# Patient Record
Sex: Female | Born: 1935 | Race: Black or African American | Hispanic: No | Marital: Married | Smoking: Current every day smoker
Health system: Southern US, Community
[De-identification: ages and names within clinical notes are randomized; demographics above are authoritative.]

## PROBLEM LIST (undated history)

## (undated) DIAGNOSIS — J9811 Atelectasis: Secondary | ICD-10-CM

## (undated) DIAGNOSIS — C50919 Malignant neoplasm of unspecified site of unspecified female breast: Secondary | ICD-10-CM

## (undated) DIAGNOSIS — C50419 Malignant neoplasm of upper-outer quadrant of unspecified female breast: Secondary | ICD-10-CM

## (undated) DIAGNOSIS — E119 Type 2 diabetes mellitus without complications: Secondary | ICD-10-CM

## (undated) DIAGNOSIS — R531 Weakness: Secondary | ICD-10-CM

## (undated) DIAGNOSIS — I890 Lymphedema, not elsewhere classified: Secondary | ICD-10-CM

## (undated) DIAGNOSIS — R0902 Hypoxemia: Secondary | ICD-10-CM

## (undated) DIAGNOSIS — J90 Pleural effusion, not elsewhere classified: Secondary | ICD-10-CM

## (undated) DIAGNOSIS — I10 Essential (primary) hypertension: Secondary | ICD-10-CM

## (undated) HISTORY — DX: Hypoxemia: R09.02

## (undated) HISTORY — PX: BREAST SURGERY: SHX581

## (undated) HISTORY — DX: Weakness: R53.1

## (undated) HISTORY — DX: Atelectasis: J98.11

## (undated) HISTORY — DX: Malignant neoplasm of unspecified site of unspecified female breast: C50.919

## (undated) HISTORY — DX: Pleural effusion, not elsewhere classified: J90

## (undated) HISTORY — DX: Malignant neoplasm of upper-outer quadrant of unspecified female breast: C50.419

## (undated) HISTORY — DX: Lymphedema, not elsewhere classified: I89.0

---

## 1999-12-24 ENCOUNTER — Encounter: Admission: RE | Admit: 1999-12-24 | Discharge: 2000-03-23 | Payer: Self-pay | Admitting: *Deleted

## 2005-09-01 ENCOUNTER — Inpatient Hospital Stay (HOSPITAL_COMMUNITY): Admission: EM | Admit: 2005-09-01 | Discharge: 2005-09-08 | Payer: Self-pay | Admitting: Emergency Medicine

## 2005-09-02 ENCOUNTER — Ambulatory Visit: Payer: Self-pay | Admitting: Emergency Medicine

## 2005-09-03 ENCOUNTER — Ambulatory Visit: Payer: Self-pay | Admitting: Cardiology

## 2005-09-03 ENCOUNTER — Encounter: Payer: Self-pay | Admitting: Cardiology

## 2008-02-15 ENCOUNTER — Encounter: Admission: RE | Admit: 2008-02-15 | Discharge: 2008-02-15 | Payer: Self-pay | Admitting: Internal Medicine

## 2008-09-04 ENCOUNTER — Ambulatory Visit (HOSPITAL_COMMUNITY): Admission: RE | Admit: 2008-09-04 | Discharge: 2008-09-04 | Payer: Self-pay | Admitting: General Surgery

## 2008-09-06 ENCOUNTER — Ambulatory Visit: Payer: Self-pay | Admitting: Oncology

## 2008-09-07 ENCOUNTER — Ambulatory Visit (HOSPITAL_COMMUNITY): Admission: RE | Admit: 2008-09-07 | Discharge: 2008-09-07 | Payer: Self-pay | Admitting: General Surgery

## 2008-09-13 LAB — CBC WITH DIFFERENTIAL/PLATELET
Basophils Absolute: 0 10*3/uL (ref 0.0–0.1)
Eosinophils Absolute: 0.4 10*3/uL (ref 0.0–0.5)
MCH: 29.5 pg (ref 26.0–34.0)
MCHC: 33 g/dL (ref 32.0–36.0)
MONO%: 8.6 % (ref 0.0–13.0)
NEUT%: 62.5 % (ref 39.6–76.8)
Platelets: 277 10*3/uL (ref 145–400)
RBC: 4.72 10*6/uL (ref 3.70–5.32)
RDW: 15.4 % — ABNORMAL HIGH (ref 11.3–14.5)
WBC: 7.1 10*3/uL (ref 3.9–10.0)
lymph#: 1.7 10*3/uL (ref 0.9–3.3)

## 2008-09-14 LAB — COMPREHENSIVE METABOLIC PANEL
Albumin: 4.5 g/dL (ref 3.5–5.2)
BUN: 36 mg/dL — ABNORMAL HIGH (ref 6–23)
Calcium: 9.7 mg/dL (ref 8.4–10.5)
Chloride: 102 mEq/L (ref 96–112)
Glucose, Bld: 125 mg/dL — ABNORMAL HIGH (ref 70–99)
Potassium: 4 mEq/L (ref 3.5–5.3)

## 2008-09-14 LAB — CANCER ANTIGEN 27.29: CA 27.29: 72 U/mL — ABNORMAL HIGH (ref 0–39)

## 2008-10-27 ENCOUNTER — Ambulatory Visit: Payer: Self-pay | Admitting: Oncology

## 2008-10-31 LAB — COMPREHENSIVE METABOLIC PANEL
ALT: 14 U/L (ref 0–35)
Albumin: 4.1 g/dL (ref 3.5–5.2)
CO2: 21 mEq/L (ref 19–32)
Calcium: 9.4 mg/dL (ref 8.4–10.5)
Chloride: 100 mEq/L (ref 96–112)
Creatinine, Ser: 1.57 mg/dL — ABNORMAL HIGH (ref 0.40–1.20)

## 2008-10-31 LAB — CBC WITH DIFFERENTIAL/PLATELET
BASO%: 0.6 % (ref 0.0–2.0)
Basophils Absolute: 0 10*3/uL (ref 0.0–0.1)
HCT: 41.3 % (ref 34.8–46.6)
HGB: 13.6 g/dL (ref 11.6–15.9)
MCHC: 32.9 g/dL (ref 32.0–36.0)
MONO#: 0.4 10*3/uL (ref 0.1–0.9)
NEUT%: 56.2 % (ref 39.6–76.8)
WBC: 5 10*3/uL (ref 3.9–10.0)
lymph#: 1.6 10*3/uL (ref 0.9–3.3)

## 2008-10-31 LAB — CANCER ANTIGEN 27.29: CA 27.29: 69 U/mL — ABNORMAL HIGH (ref 0–39)

## 2009-01-01 ENCOUNTER — Ambulatory Visit: Payer: Self-pay | Admitting: Oncology

## 2009-01-01 LAB — CBC WITH DIFFERENTIAL/PLATELET
Eosinophils Absolute: 0.2 10*3/uL (ref 0.0–0.5)
HCT: 42 % (ref 34.8–46.6)
LYMPH%: 23.9 % (ref 14.0–49.7)
MONO#: 0.4 10*3/uL (ref 0.1–0.9)
NEUT#: 3.9 10*3/uL (ref 1.5–6.5)
NEUT%: 65.4 % (ref 38.4–76.8)
Platelets: 281 10*3/uL (ref 145–400)
WBC: 6 10*3/uL (ref 3.9–10.3)

## 2009-01-01 LAB — COMPREHENSIVE METABOLIC PANEL
BUN: 28 mg/dL — ABNORMAL HIGH (ref 6–23)
CO2: 21 mEq/L (ref 19–32)
Creatinine, Ser: 1.6 mg/dL — ABNORMAL HIGH (ref 0.40–1.20)
Glucose, Bld: 107 mg/dL — ABNORMAL HIGH (ref 70–99)
Total Bilirubin: 0.3 mg/dL (ref 0.3–1.2)

## 2009-01-01 LAB — CANCER ANTIGEN 27.29: CA 27.29: 109 U/mL — ABNORMAL HIGH (ref 0–39)

## 2009-02-14 ENCOUNTER — Encounter: Admission: RE | Admit: 2009-02-14 | Discharge: 2009-02-14 | Payer: Self-pay | Admitting: Oncology

## 2009-03-19 ENCOUNTER — Ambulatory Visit: Payer: Self-pay | Admitting: Oncology

## 2009-03-21 LAB — CBC WITH DIFFERENTIAL/PLATELET
BASO%: 0.4 % (ref 0.0–2.0)
EOS%: 4.6 % (ref 0.0–7.0)
HCT: 39.4 % (ref 34.8–46.6)
MCH: 29 pg (ref 25.1–34.0)
MCHC: 33.8 g/dL (ref 31.5–36.0)
NEUT%: 57.8 % (ref 38.4–76.8)
RBC: 4.58 10*6/uL (ref 3.70–5.45)
RDW: 15.2 % — ABNORMAL HIGH (ref 11.2–14.5)
lymph#: 1.6 10*3/uL (ref 0.9–3.3)
nRBC: 0 % (ref 0–0)

## 2009-03-21 LAB — COMPREHENSIVE METABOLIC PANEL
ALT: 15 U/L (ref 0–35)
AST: 16 U/L (ref 0–37)
Albumin: 4.2 g/dL (ref 3.5–5.2)
Alkaline Phosphatase: 89 U/L (ref 39–117)
BUN: 23 mg/dL (ref 6–23)
Creatinine, Ser: 1.34 mg/dL — ABNORMAL HIGH (ref 0.40–1.20)
Potassium: 4.4 mEq/L (ref 3.5–5.3)

## 2009-03-23 ENCOUNTER — Ambulatory Visit (HOSPITAL_COMMUNITY): Admission: RE | Admit: 2009-03-23 | Discharge: 2009-03-23 | Payer: Self-pay | Admitting: Oncology

## 2009-05-03 ENCOUNTER — Ambulatory Visit (HOSPITAL_BASED_OUTPATIENT_CLINIC_OR_DEPARTMENT_OTHER): Admission: RE | Admit: 2009-05-03 | Discharge: 2009-05-04 | Payer: Self-pay | Admitting: General Surgery

## 2009-05-03 ENCOUNTER — Encounter: Admission: RE | Admit: 2009-05-03 | Discharge: 2009-05-03 | Payer: Self-pay | Admitting: General Surgery

## 2009-05-03 ENCOUNTER — Encounter (INDEPENDENT_AMBULATORY_CARE_PROVIDER_SITE_OTHER): Payer: Self-pay | Admitting: General Surgery

## 2009-06-04 ENCOUNTER — Observation Stay (HOSPITAL_COMMUNITY): Admission: RE | Admit: 2009-06-04 | Discharge: 2009-06-05 | Payer: Self-pay | Admitting: General Surgery

## 2009-06-04 ENCOUNTER — Encounter (INDEPENDENT_AMBULATORY_CARE_PROVIDER_SITE_OTHER): Payer: Self-pay | Admitting: General Surgery

## 2009-06-27 ENCOUNTER — Ambulatory Visit: Admission: RE | Admit: 2009-06-27 | Discharge: 2009-09-22 | Payer: Self-pay | Admitting: Radiation Oncology

## 2009-10-11 ENCOUNTER — Ambulatory Visit: Payer: Self-pay | Admitting: Oncology

## 2009-10-15 LAB — CBC WITH DIFFERENTIAL/PLATELET
Basophils Absolute: 0 10*3/uL (ref 0.0–0.1)
Eosinophils Absolute: 0.1 10*3/uL (ref 0.0–0.5)
HCT: 38.7 % (ref 34.8–46.6)
HGB: 12.6 g/dL (ref 11.6–15.9)
NEUT#: 3.3 10*3/uL (ref 1.5–6.5)
NEUT%: 74.2 % (ref 38.4–76.8)
RDW: 15.3 % — ABNORMAL HIGH (ref 11.2–14.5)
lymph#: 0.6 10*3/uL — ABNORMAL LOW (ref 0.9–3.3)

## 2009-10-16 LAB — COMPREHENSIVE METABOLIC PANEL
Albumin: 4.2 g/dL (ref 3.5–5.2)
BUN: 26 mg/dL — ABNORMAL HIGH (ref 6–23)
CO2: 17 mEq/L — ABNORMAL LOW (ref 19–32)
Calcium: 9.3 mg/dL (ref 8.4–10.5)
Chloride: 102 mEq/L (ref 96–112)
Creatinine, Ser: 1.75 mg/dL — ABNORMAL HIGH (ref 0.40–1.20)
Glucose, Bld: 137 mg/dL — ABNORMAL HIGH (ref 70–99)
Potassium: 4.5 mEq/L (ref 3.5–5.3)

## 2009-10-16 LAB — LACTATE DEHYDROGENASE: LDH: 175 U/L (ref 94–250)

## 2009-10-16 LAB — CANCER ANTIGEN 27.29: CA 27.29: 27 U/mL (ref 0–39)

## 2009-10-16 LAB — VITAMIN D 25 HYDROXY (VIT D DEFICIENCY, FRACTURES): Vit D, 25-Hydroxy: 7 ng/mL — ABNORMAL LOW (ref 30–89)

## 2009-11-20 ENCOUNTER — Inpatient Hospital Stay (HOSPITAL_COMMUNITY): Admission: EM | Admit: 2009-11-20 | Discharge: 2009-11-22 | Payer: Self-pay | Admitting: Emergency Medicine

## 2010-01-08 ENCOUNTER — Encounter: Admission: RE | Admit: 2010-01-08 | Discharge: 2010-01-08 | Payer: Self-pay | Admitting: Oncology

## 2010-01-21 ENCOUNTER — Ambulatory Visit: Payer: Self-pay | Admitting: Oncology

## 2010-04-19 ENCOUNTER — Ambulatory Visit: Payer: Self-pay | Admitting: Oncology

## 2010-07-30 ENCOUNTER — Ambulatory Visit: Payer: Self-pay | Admitting: Oncology

## 2010-09-26 ENCOUNTER — Ambulatory Visit: Payer: Self-pay | Admitting: Oncology

## 2011-01-26 LAB — URINALYSIS, ROUTINE W REFLEX MICROSCOPIC
Bilirubin Urine: NEGATIVE
Glucose, UA: NEGATIVE mg/dL
Hgb urine dipstick: NEGATIVE
Ketones, ur: NEGATIVE mg/dL
Nitrite: NEGATIVE
Protein, ur: NEGATIVE mg/dL
Specific Gravity, Urine: 1.009 (ref 1.005–1.030)
Urobilinogen, UA: 0.2 mg/dL (ref 0.0–1.0)
pH: 6.5 (ref 5.0–8.0)

## 2011-01-26 LAB — POCT I-STAT, CHEM 8
BUN: 20 mg/dL (ref 6–23)
Calcium, Ion: 1.08 mmol/L — ABNORMAL LOW (ref 1.12–1.32)
Chloride: 105 mEq/L (ref 96–112)
Creatinine, Ser: 1.7 mg/dL — ABNORMAL HIGH (ref 0.4–1.2)
Glucose, Bld: 131 mg/dL — ABNORMAL HIGH (ref 70–99)
HCT: 40 % (ref 36.0–46.0)
Hemoglobin: 13.6 g/dL (ref 12.0–15.0)
Potassium: 4.3 mEq/L (ref 3.5–5.1)
Sodium: 135 mEq/L (ref 135–145)
TCO2: 23 mmol/L (ref 0–100)

## 2011-01-26 LAB — BASIC METABOLIC PANEL
BUN: 16 mg/dL (ref 6–23)
CO2: 20 mEq/L (ref 19–32)
CO2: 22 mEq/L (ref 19–32)
Calcium: 8.8 mg/dL (ref 8.4–10.5)
Calcium: 8.9 mg/dL (ref 8.4–10.5)
Chloride: 105 mEq/L (ref 96–112)
Creatinine, Ser: 1.58 mg/dL — ABNORMAL HIGH (ref 0.4–1.2)
Creatinine, Ser: 1.73 mg/dL — ABNORMAL HIGH (ref 0.4–1.2)
GFR calc Af Amer: 35 mL/min — ABNORMAL LOW (ref >60)
GFR calc non Af Amer: 32 mL/min — ABNORMAL LOW (ref >60)
Glucose, Bld: 234 mg/dL — ABNORMAL HIGH (ref 70–99)
Sodium: 136 mEq/L (ref 135–145)

## 2011-01-26 LAB — DIFFERENTIAL
Basophils Absolute: 0 10*3/uL (ref 0.0–0.1)
Basophils Relative: 0 % (ref 0–1)
Eosinophils Absolute: 0 10*3/uL (ref 0.0–0.7)
Eosinophils Relative: 0 % (ref 0–5)
Lymphocytes Relative: 4 % — ABNORMAL LOW (ref 12–46)
Lymphs Abs: 0.3 10*3/uL — ABNORMAL LOW (ref 0.7–4.0)
Monocytes Absolute: 0.7 10*3/uL (ref 0.1–1.0)
Monocytes Relative: 9 % (ref 3–12)
Neutro Abs: 6.2 10*3/uL (ref 1.7–7.7)
Neutrophils Relative %: 87 % — ABNORMAL HIGH (ref 43–77)

## 2011-01-26 LAB — GLUCOSE, CAPILLARY
Glucose-Capillary: 186 mg/dL — ABNORMAL HIGH (ref 70–99)
Glucose-Capillary: 186 mg/dL — ABNORMAL HIGH (ref 70–99)
Glucose-Capillary: 45 mg/dL — ABNORMAL LOW (ref 70–99)
Glucose-Capillary: 50 mg/dL — ABNORMAL LOW (ref 70–99)
Glucose-Capillary: 56 mg/dL — ABNORMAL LOW (ref 70–99)
Glucose-Capillary: 65 mg/dL — ABNORMAL LOW (ref 70–99)
Glucose-Capillary: 66 mg/dL — ABNORMAL LOW (ref 70–99)

## 2011-01-26 LAB — CBC
HCT: 37.7 % (ref 36.0–46.0)
Hemoglobin: 11.7 g/dL — ABNORMAL LOW (ref 12.0–15.0)
Hemoglobin: 12 g/dL (ref 12.0–15.0)
Hemoglobin: 12.2 g/dL (ref 12.0–15.0)
MCHC: 32.4 g/dL (ref 30.0–36.0)
MCHC: 32.4 g/dL (ref 30.0–36.0)
MCHC: 32.5 g/dL (ref 30.0–36.0)
MCV: 91.3 fL (ref 78.0–100.0)
MCV: 91.5 fL (ref 78.0–100.0)
Platelets: 204 10*3/uL (ref 150–400)
Platelets: 214 10*3/uL (ref 150–400)
RBC: 4.05 MIL/uL (ref 3.87–5.11)
RBC: 4.12 MIL/uL (ref 3.87–5.11)
RDW: 15.3 % (ref 11.5–15.5)
RDW: 15.5 % (ref 11.5–15.5)
WBC: 7 10*3/uL (ref 4.0–10.5)
WBC: 7.1 10*3/uL (ref 4.0–10.5)

## 2011-01-26 LAB — CULTURE, RESPIRATORY W GRAM STAIN

## 2011-01-26 LAB — SODIUM, URINE, RANDOM: Sodium, Ur: 102 mEq/L

## 2011-01-26 LAB — LIPID PANEL
Triglycerides: 96 mg/dL (ref <150)
VLDL: 19 mg/dL (ref 0–40)

## 2011-01-26 LAB — CREATININE, URINE, RANDOM: Creatinine, Urine: 46.9 mg/dL

## 2011-01-26 LAB — HEMOGLOBIN A1C
Hgb A1c MFr Bld: 6.1 % (ref 4.6–6.1)
Mean Plasma Glucose: 128 mg/dL

## 2011-02-16 LAB — COMPREHENSIVE METABOLIC PANEL
ALT: 37 U/L — ABNORMAL HIGH (ref 0–35)
BUN: 21 mg/dL (ref 6–23)
CO2: 24 mEq/L (ref 19–32)
Calcium: 9.7 mg/dL (ref 8.4–10.5)
GFR calc non Af Amer: 32 mL/min — ABNORMAL LOW (ref >60)
Glucose, Bld: 123 mg/dL — ABNORMAL HIGH (ref 70–99)
Sodium: 140 mEq/L (ref 135–145)

## 2011-02-16 LAB — HEMOGLOBIN AND HEMATOCRIT, BLOOD
HCT: 39.8 % (ref 36.0–46.0)
Hemoglobin: 12.9 g/dL (ref 12.0–15.0)

## 2011-02-16 LAB — GLUCOSE, CAPILLARY

## 2011-02-17 LAB — BASIC METABOLIC PANEL
CO2: 23 mEq/L (ref 19–32)
Chloride: 105 mEq/L (ref 96–112)
Creatinine, Ser: 1.73 mg/dL — ABNORMAL HIGH (ref 0.4–1.2)
GFR calc Af Amer: 35 mL/min — ABNORMAL LOW (ref >60)
Glucose, Bld: 93 mg/dL (ref 70–99)
Sodium: 136 mEq/L (ref 135–145)

## 2011-02-17 LAB — GLUCOSE, CAPILLARY: Glucose-Capillary: 129 mg/dL — ABNORMAL HIGH (ref 70–99)

## 2011-02-17 LAB — DIFFERENTIAL
Basophils Absolute: 0 10*3/uL (ref 0.0–0.1)
Eosinophils Absolute: 0.2 10*3/uL (ref 0.0–0.7)
Eosinophils Relative: 4 % (ref 0–5)
Lymphocytes Relative: 25 % (ref 12–46)
Lymphs Abs: 1.6 10*3/uL (ref 0.7–4.0)
Monocytes Absolute: 0.6 10*3/uL (ref 0.1–1.0)

## 2011-02-17 LAB — APTT: aPTT: 36 seconds (ref 24–37)

## 2011-02-17 LAB — CBC
Hemoglobin: 13.2 g/dL (ref 12.0–15.0)
MCHC: 33.3 g/dL (ref 30.0–36.0)
MCV: 90.1 fL (ref 78.0–100.0)
RBC: 4.39 MIL/uL (ref 3.87–5.11)

## 2011-02-18 LAB — GLUCOSE, CAPILLARY: Glucose-Capillary: 97 mg/dL (ref 70–99)

## 2011-03-20 ENCOUNTER — Other Ambulatory Visit: Payer: Self-pay | Admitting: Internal Medicine

## 2011-03-20 DIAGNOSIS — R05 Cough: Secondary | ICD-10-CM

## 2011-03-20 DIAGNOSIS — R0602 Shortness of breath: Secondary | ICD-10-CM

## 2011-03-25 NOTE — Op Note (Signed)
[name], [name]             ACCOUNT NO.:  [id number]   MEDICAL RECORD NO.:  [id number]          PATIENT TYPE:  INP   LOCATION:  0002                         FACILITY:  [facility]   PHYSICIAN:  Almond Lint, MD       DATE OF BIRTH:  04/09/1931   DATE OF PROCEDURE:  06/04/2009  DATE OF DISCHARGE:                               OPERATIVE REPORT   PREOPERATIVE DIAGNOSIS:  Left T2, N2, MX breast cancer.   POSTOPERATIVE DIAGNOSIS:  Left T2, N2, MX breast cancer.   PROCEDURE:  Left mastectomy.   INDICATIONS:  [name] is a 77 year old female who had multifocal  breast cancer with 3 separate lesions very distant from one another in  the same quadrant.  We attempted a quadrantectomy, knowing there would  be a poor cosmetic outcome.  This was the patient's wishes.  We had  multiple positive margins of that time and therefore she will undergo a  left mastectomy.  She had previously had an axillary lymph node  dissection with 7/16 positive nodes.   SURGEON:  Almond Lint, MD.   ANESTHESIA:  General and local.   FINDINGS:  Seroma at the site of the prior partial mastectomy and  another seroma at the axillary lymph node dissection.   SPECIMEN:  Left breast to pathology.   ESTIMATED BLOOD LOSS:  50 mL.   COMPLICATIONS:  None known.   PROCEDURE:  [name] was identified in the holding area and taken to  the operating room where she was placed supine on the operating room  table.  General anesthesia was induced.  The left breast was prepped and  draped in a sterile fashion.  Time-out was performed according to the  surgical safety check list.  When all was correct, we continued.  The  incision was marked from a point just above the inferior inframammary  fold to the left of the sternum.  The other corner of the incision was  the axilla.  The skin lines were marked making sure they were  approximately the same length.  The top flap was approached first.  A  mixture of 0.25%  Marcaine with epinephrine, 1% lidocaine and saline was  used to anesthetize the skin flaps.  This was done on both incisions  with the spinal needle.  The superior incision was made first.  Penetrating towel clips were used to raise the skin and the Bovie was  used to take the skin flap superiorly down to the intercostal region  just to the left of the sternum and superiorly to the lower port of the  clavicle.  Along laterally, the tail of the breast was taken down on the  tail of the pectoralis major.  Inferiorly, and the skin flaps were  created down to the serratus anterior.  Superiorly, the pectoralis  fascia was elevated with hemostats and the breast tissue was taken with  pectoralis fascia with the Bovie.  The perforating vessels below the  pectoralis major were coagulated.  There were 2 that required suture  ligation.  The sternal cavity was entered from the prior mastectomy and  this was aspirated.  Additionally, the cerumen axilla was aspirated.  The latissimus was the posterior border of the breast specimen.  Once  the rest had been passed off and marginal incision on the axillary tail,  irrigation was used to check for any sites of bleeding.  There was 1  site of bleeding at the old scar tissue and this required suture  ligating as well as electrocautery.  There was a site on the upper skin  flap that also was suture ligated.  The wound was reirrigated and  reinspected for hemostasis and hemostasis had been achieved.  Two drains  were placed, one in the breast and one in the axilla.  These were  secured into place in the lateral chest wall with 2-0 nylon sutures.  The patient was noted to have some yeast infection of her skin and  therefore the wound was stapled instead of suture-closed.  The skin was  then dressed with gauze, ABDs and Ace wrap.  The patient was freed from  anesthesia and taken to PACU in stable condition.      Almond Lint, MD  Electronically  Signed     FB/MEDQ  D:  06/04/2009  T:  06/04/2009  Job:  [id number]

## 2011-03-25 NOTE — Op Note (Signed)
[name], [name]             ACCOUNT NO.:  [id number]   MEDICAL RECORD NO.:  [id number]          PATIENT TYPE:  AMB   LOCATION:  DSC                          FACILITY:  [facility]   PHYSICIAN:  Almond Lint, MD       DATE OF BIRTH:  [birth year]   DATE OF PROCEDURE:  05/03/2009  DATE OF DISCHARGE:                               OPERATIVE REPORT   PREOPERATIVE DIAGNOSIS:  Left breast cancer, clinical T3N1MX.   POSTOPERATIVE DIAGNOSIS:  Left breast cancer, clinical T3N1MX.   PROCEDURE PERFORMED:  Left partial mastectomy with axillary lymph node  dissection and mastopexy.   SURGEON:  Almond Lint, MD   ASSISTANT:  Adolph Pollack, MD   ANESTHESIA:  General.   FINDINGS:  All 3 clips from the 3 sites of cancer in the specimen.   SPECIMENS:  1. Left partial mastectomy.  2. Left axillary contents.  3. Additional anterior margin.  These were all sent to pathology for      permanent sections.   ESTIMATED BLOOD LOSS:  50 mL.   COMPLICATIONS:  None known.   PROCEDURE IN DETAIL:  Mrs. Bahe was identified in the holding area  and taken to the operating room where she was placed supine on the  operating room table.  LMA anesthesia was induced.  Her left breast and  arm were prepped and draped in a sterile fashion.  The time-out was  performed according to the surgical safety check list.  When all was  correct, we continued.  This patient has high risk for mastectomy and  mastectomy incisions were marked down on the patient.  The lower lateral  border of the potential mastectomy incision was a good site for  procurement of all 3 specimens as well as for the axillary lymph node  dissection.  An incision was made with a #10 blade.  Skin flaps were  created with the assistance of the Berens retractors.  Flaps were  created superiorly, inferiorly, and medially.  Once the wires were  approached, these were grasped with a hemostat and then another hemostat  was used to pull them  underneath the skin.  More of the wires were  pulled through, these were retracted with Allis clamps.  Once all 3  specimens were underneath the flaps, this lateral portion of the breast  was removed with the Bovie.  Several small perforating vessels were  coagulated and two required clipping.  This was not taken all way the  down to the pectoralis.  There was additional layer of breast tissue on  top of this.  The breast tissue was taken laterally to the edge of the  breast.  The area was inspected for hemostasis and then the specimen was  marked with the marking kit.  This was placed in the Faxitron to receive  images.  All 3 clips were in the specimen, however, the anterior clip  appeared to be closed.  Additional anterior margin was taken by using  the skin hooks and grasping the site of the margins.  The attention was  then turned out to the axilla.  At the lateral aspect of the breast, the  lateral border, the pectoralis major was identified and the axillary  contents were swept down into the serratus.  The long thoracic nerve was  identified and left adherent to the chest wall.  The nerve was swept  superiorly and the lateral attachments to the pectoralis minor were  taken down.  An attempt was made to save the intercostobrachial nerve,  however, this went directly through the specimen where there were  palpable lymph nodes.  The axillary vein was identified bluntly and then  dissection was carried out sharply with Metzenbaum scissors along the  inferior border of the axillary vein.  Several Rotter nodes were pulled  out between the pectoralis major and minor and incorporated into the  specimen.  The inferior border of the axillary vein was swept.  The  thoracodorsal nerve was identified and preserved.  The tissue anterior  to the thoracodorsal nerve with the anterior border of the latissimus  was swept and all the tissue lateral and anterior to the thoracodorsal  nerve was taken  with the Bovie and was clipped for the branching  vessels.  The intercostobrachial nerve was divided sharply after  clipping.  The specimen was then passed off the table.  The  subscapularis was swept and there were no nodes palpable and the  subscapularis was visible.  The axillary vein was completely visible on  the inferior surface.  The level 3 region was palpated and there were no  palpable nodes in this area.  The region of the Rotter nodes was  palpated and this area had been swept of nodes.  There was no additional  tissue seen or palpated.  The thoracodorsal and long thoracic nerves  were tested again and were indeed intact.  The axilla was inspected for  hemostasis and there was a site on the lateral aspect that was suture  ligated.  This was done after 1 L of irrigation.  A 19-Blake drain was  placed laterally in the axilla.  The skin flaps were then used to create  an appropriate mastopexy.  The breast tissue that had been towards the  approximately 2 o'clock was dissected and pulled laterally and sutured  to the to the area of the breast tissue around 5 o'clock.  The overlying  skin flaps were then closed with 3-0 Vicryl pops in interrupted fashion  and a 4-0 Monocryl in a running subcuticular fashion.  The wound was  cleaned and dried and dressed with 1/2-inch Steri-Strips.  The patient's  wounds were then dressed with gauze, ABDs, and Ace wrap.  The patient  was awakened from anesthesia and taken to PACU in stable condition.      Almond Lint, MD  Electronically Signed     FB/MEDQ  D:  05/03/2009  T:  05/04/2009  Job:  [id number]

## 2011-03-28 NOTE — Discharge Summary (Signed)
[name], [name]             ACCOUNT NO.:  [id number]   MEDICAL RECORD NO.:  [id number]          PATIENT TYPE:  INP   LOCATION:  6702                         FACILITY:  [facility]   PHYSICIAN:  Nelma Rothman, MD   DATE OF BIRTH:  11/26/1930   DATE OF ADMISSION:  09/01/2005  DATE OF DISCHARGE:  09/08/2005                                 DISCHARGE SUMMARY   PRIMARY CARE PHYSICIAN:  Dr. Nicholos Johns.   CONSULTS:  Pulmonary physician is Dr. Delton Coombes.   DISCHARGE DIAGNOSES:  1.  Combined hypoxic/hypercapnic respiratory failure, improved on BiPAP,      likely secondary to reactive airway disease and bronchiectasis.  2.  Hypertension.  3.  Diabetes mellitus type 2.  4.  Hyperlipidemia.   PROCEDURE:  Chest x-ray on admission demonstrated mild airway thickening  suggesting bronchitis or reactive airways.  1.  CT angiogram of the chest October 23 demonstrated no evidence of      pulmonary emboli or aortic dissection. There was mild central      peribronchial thickening compatible with bronchitis or reactive airways      disease and mildly enlarged right hilar lymph nodes, nonspecific but may      be reactive in nature.  2.  Followup chest x-ray demonstrated no significant change, with mild      bibasilar atelectasis.  3.  Pulmonary consult on admission provided by Dr. Delton Coombes.   LABORATORY DATA:  On admission, a blood gas was pH 7.27, pCO2 55, pO2 75,  and bicarb 25 with oxygen saturation of 93%. I believe this was done on  BiPAP although I am not sure as it was done in the ER. On discharge,  creatinine was 1.5. Hemoglobin A1c was 6.7. Fasting lipid profile  demonstrated total cholesterol 215, triglycerides 205, HDL 53, LDL 121. TSH  was within normal limits at 1.037, and urinalysis was unremarkable except  for some protein and trace blood.   HISTORY OF PRESENT ILLNESS:  Please see dictated admission history and  physical as well as brief summary dictated below.   HOSPITAL COURSE:   [name] is a very pleasant 77 year old female who  presented to the [facility] emergency department on October 23 with  shortness of breath. She had had worsening cough and shortness of breath  over the past couple of weeks, but substantially worse over the previous 48  hours. On initial presentation, her room air saturation were approximately  70%. She responded nicely in the emergency department to noninvasive support  with BiPAP and 40% oxygen. She was initially admitted to the step-down unit  and continued on the BiPAP machine for the first 48 hours. Pulmonary consult  was obtained to evaluate for the etiology of her shortness of breath. Review  of CT of her chest demonstrated some bronchial thickening, consistent with  bronchitis or reactive airways disease. There was also some peripheral  bronchiectasis which is likely chronic and may have been contributing. She  was started on empiric fluoroquinolone for community-acquired pneumonia or  bronchitis and would also cover organisms associated with bronchiectasis.  She will complete a 10-day course  of this. She was also started on  intravenous steroids which will be tapered as detailed below. She did quite  well and after 48 hours was weaned to nasal cannula. She remained in the  step-down unit for observation for an additional 24 hours before being  transferred to the floor. She was up with physical therapy and did not  require any oxygen, even with exertion.   With regard to her hypertension, she is on a number of antihypertensive  medications at home. She was not exactly the same regimen in the hospital,  but I suspect she has improved control at home as we do not have an updated  list of her home medications. She will resume all of her home medications as  previously prescribed by Dr. Nicholos Johns. I did note that she is not on an  ACE inhibitor, which would seem to be of benefit to her given her history of  diabetes. I assume  there is a reason for this and therefore did not initiate  this here in the hospital. Also, her creatinine was mildly elevated above  baseline which likely represents a small degree of contrast nephropathy  after her CT angiogram. Her creatinine on discharge was 1.5.   With regard to her diabetes, her sugars were fairly well controlled while on  the step-down unit on a small dose of Lantus as well as sliding scale. When  placed on her home regimen of glipizide, her sugars were slightly increased  to 200. I suspect that this will improve once she is tapered off the  prednisone, and she can resume her home regimen as demonstrated by an  excellent hemoglobin A1c of 6.7. She will be discharged on glipizide as  previously prescribed.   With regard to further risk modification given her history of diabetes and  hypertension, a fasting lipid profile was checked, results of which are  dictated above. She will be started on simvastatin 20 mg daily. Followup  fasting lipid profile and liver function tests to be performed by her  primary care physician.   On the morning of discharge, she was afebrile and oxygen saturations were in  the high 90% on room air. She was felt appropriate to be discharged home to  live with her son and husband as she had previously. She will follow up with  Dr. Nicholos Johns in one to two weeks.   DISCHARGE MEDICATIONS:  1.  Avelox 400 mg p.o. daily for an additional five days.  2.  Prednisone 20 mg p.o. daily for 2 days, then 10 mg p.o. daily for 3      days, then stop.  3.  Albuterol inhaler 2 puffs q.6h. and q.2h. p.r.n. shortness of breath.  4.  Aspirin 81 mg p.o. daily.  5.  Zocor 20 mg p.o. daily.   All other medications are as per her home medication regimen previously  prescribed and should be continued as follows:  1.  Prevacid 30 mg p.o. daily.  2.  Maxzide 75/50 one tablet p.o. daily.  3.  Tenex 2 mg p.o. nightly. 4.  DynaCirc CR 5 mg p.o. daily.  5.   Metoprolol 100 mg p.o. b.i.d.  6.  Glipizide ER 5 mg p.o. b.i.d.  7.  Lasix 40 mg p.o. every other day p.r.n. swelling.  8.  Phenergan 25 mg p.o. t.i.d. p.r.n. nausea and vomiting.  9.  Xanax 0.25 to 0.5 mg p.o. q.8h. p.r.n. anxiety.   DISCHARGE INSTRUCTIONS:  The patient is to follow  up with Dr. Nicholos Johns  within the next one to two weeks. She will call and make this appointment.  She will ask Dr. Nicholos Johns if she should still continue taking Lyrica as  she currently does not have any pills left and is out of refills and has  been taking this recently. She was also counseled on tobacco cessation.           ______________________________  Nelma Rothman, MD     RAR/MEDQ  D:  09/08/2005  T:  09/08/2005  Job:  [id number]

## 2011-03-28 NOTE — H&P (Signed)
[name], [name] NO.:  [id number]   MEDICAL RECORD NO.:  [id number]          PATIENT TYPE:  EMS   LOCATION:  MAJO                         FACILITY:  [facility]   PHYSICIAN:  Renato Battles, M.D.     DATE OF BIRTH:  June 28, 1931   DATE OF ADMISSION:  09/01/2005  DATE OF DISCHARGE:                                HISTORY & PHYSICAL   PRIMARY CARE PHYSICIAN:  [name], M.D.   REASON FOR ADMISSION:  Shortness of breath.   HISTORY OF PRESENT ILLNESS:  The patient is a pleasant 77 year old African-  American female who has been experiencing some shortness of breath  throughout the day, for the last month.  The patient went to see Dr.  Nicholos Johns.  She was given some treatment about 2 weeks ago; however, her  shortness of breath got much worse in the last 72 hours, to the point that  she was unable to tolerate it anymore, and presented to the emergency room  for evaluation and management.  On initial presentation, she was found to  have O2 saturations of about 70% on room air.  She responded nicely to non-  invasive support with a BiPAP machine and 40% oxygen.  The patient denies  any chest pain.  No orthopnea or PND.  The patient denies any chest pain.  No orthopnea or PND.   REVIEW OF SYSTEMS:  CONSTITUTIONAL SYMPTOMS:  No fever, chills or night  sweats.  No weight changes.  GI:  No nausea or vomiting.  No diarrhea or  constipation.  GU:  No dysuria, hematuria, or retention.  CARDIOPULMONARY:  Positive for cough.  Positive for whitish sputum.  Positive for shortness of  breath.  No PND, no orthopnea, no chest pain.   PAST MEDICAL HISTORY:  1.  Type 2 diabetes.  2.  Hypertension.   PAST SURGICAL HISTORY:  1.  Hysterectomy.  2.  Bilateral cataract surgery.   FAMILY HISTORY:  Noncontributory.   SOCIAL HISTORY:  She reports that she smokes a half a pack a day currently,  and that she used to smoke about 2 packs a day.  However, she says that she  has  only 5 years of smoking history, and prior to that she was a nonsmoker.  Denies alcohol.  Denies drugs.  She is married and lives with her husband  and son.  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   ALLERGIES:  No known drug allergies.   HOME MEDICATIONS:  1.  Metoprolol 100 mg p.o. b.i.d.  2.  Glipizide 5 mg p.o. daily.  3.  DynaCirc 5 mg p.o. daily.  4.  Prevacid 30 mg p.o. daily.  5.  Phenergan.  6.  Alprazolam 1 mg p.o. daily.  7.  Albuterol nebulizer.   PHYSICAL EXAMINATION:  GENERAL:  The patient is alert and oriented x3, in  moderate distress, on a BiPAP machine.  VITAL SIGNS:  Temperature 97.8, heart rate 95, respiratory rate 22, blood  pressure 143/61, O2 saturation 100% on BiPAP machine, 40% 12 and 6.  HEENT:  Head is atraumatic and normocephalic.  Pupils equal, round and  reactive to light.  Extraocular movements intact bilaterally.  NECK:  No lymphadenopathy, no thyromegaly, no JVD.  CHEST:  Bilateral diffuse wheezing with notably prolonged expiration.  HEART:  Regular rate and rhythm.  ABDOMEN:  Soft, obese.  Decreased bowel sounds.  Nondistended, nontender.  EXTREMITIES:  No clubbing, cyanosis, or edema.   STUDIES:  CBC showed a white count of 12 with 75% neutrophils, normal  hemoglobin, normal __________ are all old and normal, except low bicarbonate  of 19.  CBG was just 155.  Liver functions were within normal limits.  BNP  normal.  ABG showed a pH of 7.27, PCO2 of 51, PO2 of 52.  I do not know her  baseline.  UA was negative.  D-dimer was positive at 1.26.  Chest x-ray  showed no infiltrate.  Positive for some hilar congestion.   ASSESSMENT:  1.  Chronic obstructive pulmonary disease exacerbation.  2.  Need to rule out myocardial infarction, given her diabetic history.  3.  Hypertension.  4.  Type 2 diabetes.  5.  Need to rule out pulmonary embolus, given her profound hypoxemia and      lack of baseline measurements.   PLAN:  1.  Admit to the  step-down unit.  2.  Check cardiac enzymes q.8h.  3.  Start steroids, 60 mg of Solu-Medrol q.6h. IV.  4.  Nebulizer treatment with albuterol and Atrovent.  5.  Continue home medications.  6.  Check a spiral CT scan of the chest to rule out pulmonary embolism.      Renato Battles, M.D.  Electronically Signed     SA/MEDQ  D:  09/01/2005  T:  09/02/2005  Job:  [id number]   cc:   [name], M.D.  Fax: [phone]

## 2011-04-01 ENCOUNTER — Ambulatory Visit
Admission: RE | Admit: 2011-04-01 | Discharge: 2011-04-01 | Disposition: A | Discharge status: Home / Self Care | Payer: Medicare Other | Source: Ambulatory Visit | Attending: Internal Medicine | Admitting: Internal Medicine

## 2011-04-01 ENCOUNTER — Other Ambulatory Visit: Payer: Self-pay | Admitting: Internal Medicine

## 2011-04-01 DIAGNOSIS — R0602 Shortness of breath: Secondary | ICD-10-CM

## 2011-04-01 DIAGNOSIS — R05 Cough: Secondary | ICD-10-CM

## 2011-05-29 ENCOUNTER — Telehealth (INDEPENDENT_AMBULATORY_CARE_PROVIDER_SITE_OTHER): Payer: Self-pay

## 2011-06-02 NOTE — Telephone Encounter (Signed)
Paged Dr. Donell Beers concerning Ms. Achey's are, pt. States she's having some swelling and arm discomfort.  Per Dr. Donell Beers pt need's to follow-up with PCP that she felt it was cellulitis.

## 2011-08-12 LAB — GLUCOSE, CAPILLARY: Glucose-Capillary: 132 — ABNORMAL HIGH

## 2012-10-14 ENCOUNTER — Other Ambulatory Visit: Payer: Self-pay | Admitting: Internal Medicine

## 2012-10-14 DIAGNOSIS — I889 Nonspecific lymphadenitis, unspecified: Secondary | ICD-10-CM

## 2012-10-14 DIAGNOSIS — N63 Unspecified lump in unspecified breast: Secondary | ICD-10-CM

## 2012-10-14 DIAGNOSIS — Z9012 Acquired absence of left breast and nipple: Secondary | ICD-10-CM

## 2012-10-26 ENCOUNTER — Other Ambulatory Visit: Payer: Medicare Other

## 2012-11-12 ENCOUNTER — Encounter (HOSPITAL_COMMUNITY): Payer: Self-pay | Admitting: Emergency Medicine

## 2012-11-12 ENCOUNTER — Emergency Department (HOSPITAL_COMMUNITY)
Admission: EM | Admit: 2012-11-12 | Discharge: 2012-11-12 | Disposition: A | Discharge status: Home / Self Care | Payer: Medicare Other | Source: Home / Self Care | Attending: Emergency Medicine | Admitting: Emergency Medicine

## 2012-11-12 DIAGNOSIS — B372 Candidiasis of skin and nail: Secondary | ICD-10-CM

## 2012-11-12 DIAGNOSIS — B029 Zoster without complications: Secondary | ICD-10-CM

## 2012-11-12 HISTORY — DX: Essential (primary) hypertension: I10

## 2012-11-12 HISTORY — DX: Type 2 diabetes mellitus without complications: E11.9

## 2012-11-12 MED ORDER — VALACYCLOVIR HCL 1 G PO TABS: 1000.0000 mg | ORAL_TABLET | Freq: Three times a day (TID) | ORAL | Status: AC

## 2012-11-12 MED ORDER — NYSTATIN 100000 UNIT/GM EX POWD: Freq: Four times a day (QID) | CUTANEOUS | Status: DC

## 2012-11-12 MED ORDER — CLOTRIMAZOLE-BETAMETHASONE 1-0.05 % EX CREA: TOPICAL_CREAM | CUTANEOUS | Status: DC

## 2012-11-12 NOTE — ED Provider Notes (Signed)
Medical screening examination/treatment/procedure(s) were performed by non-physician practitioner and as supervising physician I was immediately available for consultation/collaboration.  Neria Procter   Blue Ruggerio, MD 11/12/12 1838 

## 2012-11-12 NOTE — ED Notes (Signed)
Pt c/o poss shingles x1 month Rash is on right side of breast/side/chest and right upper back.  Saw her PCP x1 month and was given an antibiotic for Skin Infection??? Sx include: painful, itching, vomiting, nauseas Denies: fevers, diarrhea She is alert w/no signs of acute distress.

## 2012-11-12 NOTE — ED Provider Notes (Signed)
History     CSN: [id number]  Arrival date & time 11/12/12  1532   First MD Initiated Contact with Patient 11/12/12 1612      Chief Complaint  Patient presents with  . Herpes Zoster    (Consider location/radiation/quality/duration/timing/severity/associated sxs/prior treatment) HPI Comments: 79 year old female presents with a rash on her right breast. She states that she saw paramedic and they told her it was shingles. She states this rash has been on her right breast and right lateral chest wall for approximately 2 months. She saw her physician one month ago and treated for a skin infection. This had no apparent effect according to the patient and her son. The rash is described as 2-3 mm annular well marginated red papular vesicular slightly raised lesions. A few of these are macular. There is minor tenderness in the area but palpation does not produce significant pain.   Past Medical History  Diagnosis Date  . Hypertension   . Diabetes mellitus without complication     Past Surgical History  Procedure Date  . Breast surgery     No family history on file.  History  Substance Use Topics  . Smoking status: Current Every Day Smoker  . Smokeless tobacco: Not on file  . Alcohol Use: No    OB History    Grav Para Term Preterm Abortions TAB SAB Ect Mult Living                  Review of Systems  Constitutional: Negative.   Respiratory: Negative.   Cardiovascular: Negative.   Gastrointestinal: Positive for nausea.  Neurological: Negative.   All other systems reviewed and are negative.    Allergies  Review of patient's allergies indicates no known allergies.  Home Medications   Current Outpatient Rx  Name  Route  Sig  Dispense  Refill  . AMLODIPINE BESYLATE 5 MG PO TABS   Oral   Take 5 mg by mouth daily.         Marland Kitchen CLOTRIMAZOLE-BETAMETHASONE 1-0.05 % EX CREA      Apply to affected area 2 times daily prn   45 g   0   . CYCLOBENZAPRINE HCL 10 MG PO TABS  Oral   Take 10 mg by mouth 3 (three) times daily as needed.         . NYSTATIN 100000 UNIT/GM EX POWD   Topical   Apply topically 4 (four) times daily.   15 g   0   . PANTOPRAZOLE SODIUM 40 MG PO TBEC   Oral   Take 40 mg by mouth daily.         Marland Kitchen SIMVASTATIN 20 MG PO TABS   Oral   Take 20 mg by mouth every evening.         Marland Kitchen VALACYCLOVIR HCL 1 G PO TABS   Oral   Take 1 tablet (1,000 mg total) by mouth 3 (three) times daily.   21 tablet   0     BP 122/80  Pulse 86  Temp 98.5 F (36.9 C) (Oral)  Resp 20  SpO2 96%  Physical Exam  Nursing note and vitals reviewed. Constitutional: She is oriented to person, place, and time. She appears well-developed and well-nourished.  Neck: Normal range of motion. Neck supple.  Cardiovascular: Normal rate and regular rhythm.   Pulmonary/Chest: Effort normal.  Musculoskeletal: She exhibits no edema.  Lymphadenopathy:    She has no cervical adenopathy.  Neurological: She is alert and oriented to  person, place, and time. She exhibits normal muscle tone.  Skin: Skin is warm and dry.       See history of present illness for description of the rash. It is located in the right hemisphere right breast. There are also a couple of lesions in the right lateral chest wall. This could be an area of the T4-5 dermatomes. Also other skin changes of the right breast including thickening of the underlying skin. Nipple is everted.  Psychiatric: She has a normal mood and affect.    ED Course  Procedures (including critical care time)  Labs Reviewed - No data to display No results found.   1. Herpes zoster   2. Candidal intertrigo       MDM  She has rather significant candida intertrigo beneath the right breast. Nystatin powder 2-4 times a day. Keep area dry, use hair dryer after bathing. Lotrisone cream to the affected area twice a day The skin lesions of the right lateral breast and right lateral chest wall and certainly could  represent a zoster. Am also concerned that due to some thickening of the tissue of the breast associated with this rash is concerning for possible malignancy. She has had a left mastectomy for left breast cancer in the remote past. She has an appointment on the 17th of this month at the breast clinic and will have a mammo gram at that time. She and her son is instructed to make sure she makes that visit and to see her breast Dr. this month as soon as possible. I voiced my concern that the skin thickening and other associated changes can be associated with metastatic changes. Valtrex 1000 mg 3 times a day for 7 days         Hayden Rasmussen, NP 11/12/12 1649

## 2012-11-23 ENCOUNTER — Other Ambulatory Visit: Payer: Medicare Other

## 2012-12-07 ENCOUNTER — Encounter (INDEPENDENT_AMBULATORY_CARE_PROVIDER_SITE_OTHER): Payer: Self-pay | Admitting: General Surgery

## 2012-12-23 ENCOUNTER — Other Ambulatory Visit: Payer: Medicare Other

## 2013-01-04 ENCOUNTER — Ambulatory Visit
Admission: RE | Admit: 2013-01-04 | Discharge: 2013-01-04 | Disposition: A | Discharge status: Home / Self Care | Payer: Medicare Other | Source: Ambulatory Visit | Attending: Internal Medicine | Admitting: Internal Medicine

## 2013-01-04 ENCOUNTER — Other Ambulatory Visit: Payer: Self-pay | Admitting: Internal Medicine

## 2013-01-04 DIAGNOSIS — N63 Unspecified lump in unspecified breast: Secondary | ICD-10-CM

## 2013-01-04 DIAGNOSIS — I889 Nonspecific lymphadenitis, unspecified: Secondary | ICD-10-CM

## 2013-01-04 DIAGNOSIS — Z9012 Acquired absence of left breast and nipple: Secondary | ICD-10-CM

## 2013-01-06 ENCOUNTER — Telehealth (INDEPENDENT_AMBULATORY_CARE_PROVIDER_SITE_OTHER): Payer: Self-pay | Admitting: *Deleted

## 2013-01-06 ENCOUNTER — Other Ambulatory Visit: Payer: Medicare Other

## 2013-01-06 ENCOUNTER — Encounter (INDEPENDENT_AMBULATORY_CARE_PROVIDER_SITE_OTHER): Payer: Self-pay | Admitting: General Surgery

## 2013-01-06 ENCOUNTER — Telehealth (INDEPENDENT_AMBULATORY_CARE_PROVIDER_SITE_OTHER): Payer: Self-pay

## 2013-01-06 NOTE — Telephone Encounter (Signed)
Called patient to let your know that her appt that was scheduled for today at 215p has been rescheduled to 01/27/13 at 2p.  This reschedule was per patient request since she has a migraine HA today.  Patient agreeable at this time with future appt time.

## 2013-01-06 NOTE — Telephone Encounter (Signed)
Close encounter 

## 2013-01-13 ENCOUNTER — Other Ambulatory Visit: Payer: Medicare Other

## 2013-01-20 ENCOUNTER — Other Ambulatory Visit: Payer: Self-pay | Admitting: Diagnostic Radiology

## 2013-01-20 ENCOUNTER — Ambulatory Visit
Admission: RE | Admit: 2013-01-20 | Discharge: 2013-01-20 | Disposition: A | Discharge status: Home / Self Care | Payer: Medicare Other | Source: Ambulatory Visit | Attending: Internal Medicine | Admitting: Internal Medicine

## 2013-01-20 DIAGNOSIS — N63 Unspecified lump in unspecified breast: Secondary | ICD-10-CM

## 2013-01-20 DIAGNOSIS — I889 Nonspecific lymphadenitis, unspecified: Secondary | ICD-10-CM

## 2013-01-27 ENCOUNTER — Other Ambulatory Visit (INDEPENDENT_AMBULATORY_CARE_PROVIDER_SITE_OTHER): Payer: Self-pay | Admitting: General Surgery

## 2013-01-27 ENCOUNTER — Telehealth: Payer: Self-pay | Admitting: *Deleted

## 2013-01-27 ENCOUNTER — Ambulatory Visit (INDEPENDENT_AMBULATORY_CARE_PROVIDER_SITE_OTHER): Payer: Medicare Other | Admitting: General Surgery

## 2013-01-27 VITALS — BP 160/80 | HR 108 | Temp 98.4°F | Resp 20 | Ht 62.0 in | Wt 204.6 lb

## 2013-01-27 DIAGNOSIS — C50919 Malignant neoplasm of unspecified site of unspecified female breast: Secondary | ICD-10-CM

## 2013-01-27 DIAGNOSIS — I890 Lymphedema, not elsewhere classified: Secondary | ICD-10-CM

## 2013-01-27 DIAGNOSIS — C50911 Malignant neoplasm of unspecified site of right female breast: Secondary | ICD-10-CM

## 2013-01-27 HISTORY — DX: Lymphedema, not elsewhere classified: I89.0

## 2013-01-27 LAB — CBC WITH DIFFERENTIAL/PLATELET
Basophils Relative: 1 % (ref 0–1)
Hemoglobin: 11.2 g/dL — ABNORMAL LOW (ref 12.0–15.0)
Lymphs Abs: 0.9 10*3/uL (ref 0.7–4.0)
MCHC: 32.5 g/dL (ref 30.0–36.0)
Monocytes Relative: 9 % (ref 3–12)
Neutro Abs: 4.1 10*3/uL (ref 1.7–7.7)
Neutrophils Relative %: 72 % (ref 43–77)
Platelets: 399 10*3/uL (ref 150–400)
RBC: 4.01 MIL/uL (ref 3.87–5.11)

## 2013-01-27 LAB — COMPREHENSIVE METABOLIC PANEL
ALT: 8 U/L (ref 0–35)
Albumin: 3.1 g/dL — ABNORMAL LOW (ref 3.5–5.2)
Alkaline Phosphatase: 90 U/L (ref 39–117)
Glucose, Bld: 161 mg/dL — ABNORMAL HIGH (ref 70–99)
Potassium: 4.5 mEq/L (ref 3.5–5.3)
Sodium: 138 mEq/L (ref 135–145)
Total Protein: 6.5 g/dL (ref 6.0–8.3)

## 2013-01-27 MED ORDER — TRAMADOL HCL 50 MG PO TABS: 50.0000 mg | ORAL_TABLET | Freq: Four times a day (QID) | ORAL | Status: DC | PRN

## 2013-01-27 NOTE — Assessment & Plan Note (Addendum)
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 Punch biopsy to evaluate skin for dermal involvement.  Pt appears to have recurrence on left chest wall and axilla as well.   Will order PET, chest CT, and labs to be done prior to oncology referral.  Will make sure she gets presented at conference. This is very advanced breast cancer.

## 2013-01-27 NOTE — Patient Instructions (Signed)
Get PET scan, chest CT, and labs.  We will refer to oncology and physical therapy.

## 2013-01-27 NOTE — Telephone Encounter (Signed)
Dr. Donell Beers called requesting an urgent appt and I went to Dr. Welton Flakes and obtained and appt.  Gave 01/31/13 at 3:30 appt to Dr. Donell Beers over the phone and she was going to give info to pt.  Mailed before letter & packet to pt.  Took paperwork to Med Rec for chart.

## 2013-01-27 NOTE — Assessment & Plan Note (Signed)
I think her left upper extremity lymphedema reflects recurrent cancer in her left axilla. However, I do think she would still benefit from limb flow therapy. If nothing else, she would receive education regarding elevation and massage technique.

## 2013-01-28 ENCOUNTER — Other Ambulatory Visit: Payer: Self-pay | Admitting: Emergency Medicine

## 2013-01-28 ENCOUNTER — Telehealth (INDEPENDENT_AMBULATORY_CARE_PROVIDER_SITE_OTHER): Payer: Self-pay | Admitting: General Surgery

## 2013-01-28 ENCOUNTER — Other Ambulatory Visit (INDEPENDENT_AMBULATORY_CARE_PROVIDER_SITE_OTHER): Payer: Self-pay | Admitting: General Surgery

## 2013-01-28 DIAGNOSIS — C50912 Malignant neoplasm of unspecified site of left female breast: Secondary | ICD-10-CM

## 2013-01-28 NOTE — Telephone Encounter (Signed)
Spoke with patient's son he is aware of all appt  Pet and ct scan 02/03/13 and DR apt with Dr Park Breed at 330 01/31/13

## 2013-01-31 ENCOUNTER — Other Ambulatory Visit: Payer: Medicare Other | Admitting: Lab

## 2013-01-31 ENCOUNTER — Ambulatory Visit: Payer: Medicare Other | Admitting: Oncology

## 2013-01-31 ENCOUNTER — Ambulatory Visit: Payer: Medicare Other

## 2013-01-31 ENCOUNTER — Other Ambulatory Visit: Payer: Self-pay | Admitting: *Deleted

## 2013-01-31 DIAGNOSIS — C50419 Malignant neoplasm of upper-outer quadrant of unspecified female breast: Secondary | ICD-10-CM | POA: Insufficient documentation

## 2013-01-31 DIAGNOSIS — C50411 Malignant neoplasm of upper-outer quadrant of right female breast: Secondary | ICD-10-CM

## 2013-01-31 HISTORY — DX: Malignant neoplasm of upper-outer quadrant of unspecified female breast: C50.419

## 2013-02-01 NOTE — Progress Notes (Signed)
Chief Complaint  Patient presents with  . Breast Cancer Long Term Follow Up    recheck breast    HISTORY: Pt presents with recurrent breast cancer.  She had left breast cancer treated with left mastectomy and axillary lymph node dissection in 2010.  She presented back with severe left arm swelling and right breast skin lesions.  She was lost to follow up despite calls back from our office and Dr. Renelda Loma office.  She has not had mammo on right or had exam for several years.  She is having severe pain in both arms, left > right.  She went to ED and was told she had shingles.  However, she got back in for mammogram and was found to have right skin thickening suspicious for inflammatory breast cancer and right axillary lymph node enlargement suspicious for metastasis.  The lymph node was biopsied and was cancer.    Past Medical History  Diagnosis Date  . Hypertension   . Diabetes mellitus without complication     Past Surgical History  Procedure Laterality Date  . Breast surgery      Current Outpatient Prescriptions  Medication Sig Dispense Refill  . amLODipine (NORVASC) 5 MG tablet Take 5 mg by mouth daily.      . clotrimazole-betamethasone (LOTRISONE) cream Apply to affected area 2 times daily prn  45 g  0  . cyclobenzaprine (FLEXERIL) 10 MG tablet Take 10 mg by mouth 3 (three) times daily as needed.      . nystatin (MYCOSTATIN) powder Apply topically 4 (four) times daily.  15 g  0  . pantoprazole (PROTONIX) 40 MG tablet Take 40 mg by mouth daily.      . simvastatin (ZOCOR) 20 MG tablet Take 20 mg by mouth every evening.      . traMADol (ULTRAM) 50 MG tablet Take 1 tablet (50 mg total) by mouth every 6 (six) hours as needed for pain.  60 tablet  0   No current facility-administered medications for this visit.     No Known Allergies   No family history on file.   History   Social History  . Marital Status: Married    Spouse Name: N/A    Number of Children: N/A  . Years of  Education: N/A   Social History Main Topics  . Smoking status: Current Every Day Smoker  . Smokeless tobacco: Not on file  . Alcohol Use: No  . Drug Use: No  . Sexually Active:    REVIEW OF SYSTEMS - PERTINENT POSITIVES ONLY: 12 point review of systems negative other than HPI and PMH.    EXAMCeasar Mons Vitals:   01/27/13 1453  BP: 160/80  Pulse: 108  Temp: 98.4 F (36.9 C)  Resp: 20    Gen:  No acute distress.  Well nourished and well groomed.   Neurological: Alert and oriented to person, place, and time. Coordination normal.  Head: Normocephalic and atraumatic.  Eyes: Conjunctivae are normal. Pupils are equal, round, and reactive to light. No scleral icterus.  Neck: Normal range of motion. Neck supple. No tracheal deviation or thyromegaly present.  Cardiovascular: Normal rate, regular rhythm, normal heart sounds and intact distal pulses.  Exam reveals no gallop and no friction rub.  No murmur heard. Breast:  Right breast with innumerable dermal implants.  Left chest wall and axilla very firm.  Suspicious for recurrence as well.   Respiratory: Effort normal.  No respiratory distress. No chest wall tenderness. Breath sounds normal.  No  wheezes, rales or rhonchi.  GI: Soft. Bowel sounds are normal. The abdomen is soft and nontender.  There is no rebound and no guarding.  Musculoskeletal: Normal range of motion. Extremities are nontender.  Lymphadenopathy: Marked right axillary adenopathy, matted.  Left axilla rock hard.   Skin: Skin is warm and dry. No rash noted. No diaphoresis. No erythema. No pallor. Bilateral upper extremity edema.  Left arm markedly edematous.   Psychiatric: Normal mood and affect. Behavior is normal. Judgment and thought content normal.    LABORATORY RESULTS: Available labs are reviewed  Diagnosis Lymph node, needle/core biopsy, right axillary - POSITIVE FOR DUCTAL CARCINOMA.  RADIOLOGY RESULTS: See E-Chart or I-Site for most recent results.  Images and  reports are reviewed.  Mammogram  IMPRESSION:  1. Findings are suspicious for inflammatory breast cancer/advanced  right breast cancer with metastatic right axillary lymph node.  2. Suspect skin metastases and possible lymphadenopathy of the  left axilla.    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 Punch biopsy to evaluate skin for dermal involvement.  Pt appears to have recurrence on left chest wall and axilla as well.   Will order PET, chest CT, and labs to be done prior to oncology referral.  Will make sure she gets presented at conference. This is very advanced breast cancer.      Lymphedema, left arm I think her left upper extremity lymphedema reflects recurrent cancer in her left axilla. However, I do think she would still benefit from limb flow therapy. If nothing else, she would receive education regarding elevation and massage technique.     Pt educated about importance of immediate follow up with oncology.  Maudry Diego MD Surgical Oncology, General and Endocrine Surgery [facility]      Visit Diagnoses: 1. Breast cancer, right breast   2. Lymphedema, left arm     Primary Care Physician: [name], MD

## 2013-02-02 ENCOUNTER — Telehealth: Payer: Self-pay | Admitting: *Deleted

## 2013-02-02 NOTE — Telephone Encounter (Signed)
Called pt to reschedule her missed appt and confirmed 02/10/13 appt w/ pt.  Mailed before appt letter & packet to pt.  Emailed Bernie to make her aware.  Updated chart.

## 2013-02-03 ENCOUNTER — Encounter (HOSPITAL_COMMUNITY): Payer: Medicare Other

## 2013-02-10 ENCOUNTER — Telehealth: Payer: Self-pay | Admitting: *Deleted

## 2013-02-10 ENCOUNTER — Other Ambulatory Visit (HOSPITAL_BASED_OUTPATIENT_CLINIC_OR_DEPARTMENT_OTHER): Payer: Medicare Other | Admitting: Lab

## 2013-02-10 ENCOUNTER — Encounter: Payer: Self-pay | Admitting: Oncology

## 2013-02-10 ENCOUNTER — Ambulatory Visit (HOSPITAL_BASED_OUTPATIENT_CLINIC_OR_DEPARTMENT_OTHER): Payer: Medicare Other | Admitting: Oncology

## 2013-02-10 ENCOUNTER — Encounter: Payer: Self-pay | Admitting: *Deleted

## 2013-02-10 ENCOUNTER — Ambulatory Visit: Payer: Medicare Other

## 2013-02-10 VITALS — BP 142/90 | HR 76 | Temp 97.9°F | Resp 20 | Ht 62.0 in | Wt 202.0 lb

## 2013-02-10 DIAGNOSIS — C50411 Malignant neoplasm of upper-outer quadrant of right female breast: Secondary | ICD-10-CM

## 2013-02-10 DIAGNOSIS — Z9010 Acquired absence of unspecified breast and nipple: Secondary | ICD-10-CM

## 2013-02-10 DIAGNOSIS — C50419 Malignant neoplasm of upper-outer quadrant of unspecified female breast: Secondary | ICD-10-CM

## 2013-02-10 DIAGNOSIS — I890 Lymphedema, not elsewhere classified: Secondary | ICD-10-CM

## 2013-02-10 DIAGNOSIS — C773 Secondary and unspecified malignant neoplasm of axilla and upper limb lymph nodes: Secondary | ICD-10-CM

## 2013-02-10 DIAGNOSIS — Z170 Estrogen receptor positive status [ER+]: Secondary | ICD-10-CM

## 2013-02-10 DIAGNOSIS — C50911 Malignant neoplasm of unspecified site of right female breast: Secondary | ICD-10-CM

## 2013-02-10 LAB — COMPREHENSIVE METABOLIC PANEL (CC13)
ALT: 8 U/L (ref 0–55)
AST: 18 U/L (ref 5–34)
Albumin: 2.3 g/dL — ABNORMAL LOW (ref 3.5–5.0)
Alkaline Phosphatase: 96 U/L (ref 40–150)
Potassium: 4.7 mEq/L (ref 3.5–5.1)
Sodium: 137 mEq/L (ref 136–145)
Total Bilirubin: 0.25 mg/dL (ref 0.20–1.20)
Total Protein: 7.4 g/dL (ref 6.4–8.3)

## 2013-02-10 LAB — CBC WITH DIFFERENTIAL/PLATELET
BASO%: 0.8 % (ref 0.0–2.0)
EOS%: 2.7 % (ref 0.0–7.0)
Eosinophils Absolute: 0.2 10*3/uL (ref 0.0–0.5)
LYMPH%: 11.2 % — ABNORMAL LOW (ref 14.0–49.7)
MCH: 28.9 pg (ref 25.1–34.0)
MCHC: 32.3 g/dL (ref 31.5–36.0)
MCV: 89.5 fL (ref 79.5–101.0)
MONO%: 6.5 % (ref 0.0–14.0)
NEUT#: 4.9 10*3/uL (ref 1.5–6.5)
Platelets: 429 10*3/uL — ABNORMAL HIGH (ref 145–400)
RBC: 3.84 10*6/uL (ref 3.70–5.45)
RDW: 15.5 % — ABNORMAL HIGH (ref 11.2–14.5)

## 2013-02-10 MED ORDER — LETROZOLE 2.5 MG PO TABS: 2.5000 mg | ORAL_TABLET | Freq: Every day | ORAL | Status: DC

## 2013-02-10 NOTE — Telephone Encounter (Signed)
appts made and printed 

## 2013-02-10 NOTE — Patient Instructions (Addendum)
You have recurrent breast cancer  We discussed staging scans to see if there is cancer anywhere else in your body,we will do CT scan  We discussed enrolling you on a clinical study (Bolero-4)  I will see you back in 4 weeks

## 2013-02-10 NOTE — Progress Notes (Signed)
KERYN NESSLER [id number] Jan 04, 1931 79 y.o. 02/10/2013 4:01 PM  CC  [name], MD [address] [place] [address] Dr. Almond Lint  REASON FOR CONSULTATION:  79 year old female with prior history of left breast cancer that was treated with a mastectomy and axillary lymph node dissection and 2010. She had been lost to followup and now with recurrent disease.   STAGE:     REFERRING PHYSICIAN: Dr. Almond Lint  HISTORY OF PRESENT ILLNESS:  JAIDY COTTAM is a 79 y.o. female.  With multiple medical problems including hypertension diabetes. In 2010 patient presented with rest mass she subsequently underwent a left mastectomy with axillary lymph node dissection in 2010. She was referred to medical oncology but was then subsequently lost to followup. She has not had a mammogram performed on the right or have any kind of the examination. Recently she presented to Dr. Arita Miss office for long-term followup with severe left arm swelling and right breast skin lesions. She had pain in the left arm greater than the right. She had gone to the emergency department at that time she had been told that she had shingles. She did have a mammogram performed and that showed right skin thickening suspicious for inflammatory breast cancer and is very nodes suspicious for metastasis. Lymph nodes were biopsied consistent with a primary breast cancer. Tumor was ER positive. She was referred to medical oncology for discussion of neoadjuvant therapy.   Past Medical History: Past Medical History  Diagnosis Date  . Hypertension   . Diabetes mellitus without complication   . Breast cancer   Arthritis  Past Surgical History: Past Surgical History  Procedure Laterality Date  . Breast surgery    left Mastectomy 2010  Family History: No family history on file.  Social History History  Substance Use Topics  . Smoking status: Current Every Day Smoker  . Smokeless tobacco:  Not on file  . Alcohol Use: No    Allergies: No Known Allergies  Current Medications: Current Outpatient Prescriptions  Medication Sig Dispense Refill  . amLODipine (NORVASC) 5 MG tablet Take 5 mg by mouth daily.      . clotrimazole-betamethasone (LOTRISONE) cream Apply to affected area 2 times daily prn  45 g  0  . cyclobenzaprine (FLEXERIL) 10 MG tablet Take 10 mg by mouth 3 (three) times daily as needed.      Marland Kitchen glipiZIDE (GLUCOTROL) 10 MG tablet Take 10 mg by mouth 2 (two) times daily before a meal. Takes 2 in am and 1 in evening      . pantoprazole (PROTONIX) 40 MG tablet Take 40 mg by mouth daily.      . simvastatin (ZOCOR) 20 MG tablet Take 20 mg by mouth every evening.      . traMADol (ULTRAM) 50 MG tablet Take 1 tablet (50 mg total) by mouth every 6 (six) hours as needed for pain.  60 tablet  0  . nystatin (MYCOSTATIN) powder Apply topically 4 (four) times daily.  15 g  0   No current facility-administered medications for this visit.    OB/GYN History: menarche at 49, menopause at age 80, HRT, First live birth at 43, G60P2  Fertility Discussion: N/A Prior History of Cancer: see HPI  Health Maintenance:  Colonoscopy none Bone Density none Last PAP smear yes  ECOG PERFORMANCE STATUS: 2 - Symptomatic, <50% confined to bed  Genetic Counseling/testing: N/A  REVIEW OF SYSTEMS:  Comprehensive review of systems is recorded separately in the electronic medical  record. However patient is very weak tired fatigued she has obvious left arm swelling she has multiple lesions in the mastectomy region as well as the contralateral side. They are consistent with recurrence. She denies having nausea vomiting headaches double vision or blurring of vision.  PHYSICAL EXAMINATION: Blood pressure 142/90, pulse 76, temperature 97.9 F (36.6 C), temperature source Oral, resp. rate 20, height 5\' 2"  (1.575 m), weight 202 lb (91.627 kg).      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  Results were discussed with the patient by telephone at her request.  She reports no complications from the procedure.  The patient is scheduled to be seen by Dr. Donell Beers on 01/27/2013 at  1:40 p.m.  **END ADDENDUM** SIGNED BY: Cain Saupe, M.D.   01/20/2013  *RADIOLOGY REPORT*  Clinical Data:  History of left mastectomy.  The patient has increasing left upper extremity lymphedema.  Right breast skin and trabecular thickening.  Numerous skin lesions over the right breast and left axilla.  Enlarged right axillary lymph nodes.  The patient presents for biopsy.  ULTRASOUND GUIDED CORE BIOPSY OF THE RIGHT AXILLA  Comparison: Previous exams.  I met with the patient and we discussed the procedure of ultrasound- guided biopsy, including benefits and alternatives.  We discussed the high likelihood of a successful procedure. We discussed the risks of the procedure, including infection, bleeding, tissue injury, clip migration, and inadequate sampling.  Informed written consent was given.  Using sterile technique 2% lidocaine, ultrasound guidance and a 14 gauge automated biopsy device, biopsy was performed of enlarged right axillary lymph node using a lateral approach. Specimens from the same lymph node are submitted in formalin and in saline as needed for flow cytometry.  IMPRESSION: Ultrasound guided biopsy of enlarged right axillary lymph node.  No apparent complications.  Original Report Authenticated By: Norva Pavlov, M.D.      LABS:    Chemistry      Component Value Date/Time   NA 137 02/10/2013 1502   NA 138 01/27/2013 1517   K 4.7 02/10/2013 1502   K 4.5 01/27/2013 1517   CL 105 02/10/2013 1502   CL 107 01/27/2013 1517   CO2 20* 02/10/2013 1502   CO2 22 01/27/2013 1517   BUN 23.5 02/10/2013 1502   BUN 23 01/27/2013  1517   CREATININE 1.7* 02/10/2013 1502   CREATININE 1.41* 01/27/2013 1517   CREATININE 1.58* 11/22/2009 0521      Component Value Date/Time   CALCIUM 9.4 02/10/2013 1502   CALCIUM 8.9 01/27/2013 1517   ALKPHOS 96 02/10/2013 1502   ALKPHOS 90 01/27/2013 1517   AST 18 02/10/2013 1502   AST 20 01/27/2013 1517   ALT 8 02/10/2013 1502   ALT 8 01/27/2013 1517   BILITOT 0.25 02/10/2013 1502   BILITOT 0.3 01/27/2013 1517      Lab Results  Component Value Date   WBC 6.2 02/10/2013   HGB 11.1* 02/10/2013   HCT 34.4* 02/10/2013   MCV 89.5 02/10/2013   PLT 429* 02/10/2013   PATHOLOGY: ADDITIONAL INFORMATION: PROGNOSTIC INDICATORS - ACIS Results: IMMUNOHISTOCHEMICAL AND MORPHOMETRIC ANALYSIS BY THE AUTOMATED CELLULAR IMAGING SYSTEM (ACIS) Estrogen Receptor: 100%, POSITIVE, STRONG STAINING INTENSITY Progesterone Receptor: 61%, POSITIVE, STRONG STAINING INTENSITY Proliferation Marker Ki67: 68% REFERENCE RANGE ESTROGEN RECEPTOR NEGATIVE <1% POSITIVE =>1%  PROGESTERONE RECEPTOR NEGATIVE <1% POSITIVE =>1% All controls stained appropriately Pecola Leisure MD Pathologist, Electronic Signature ( Signed 02/02/2013) CHROMOGENIC IN-SITU HYBRIDIZATION Results: HER-2/NEU BY CISH - NO AMPLIFICATION OF HER-2 DETECTED. RESULT RATIO OF HER2: CEP 17 SIGNALS 1.14 AVERAGE HER2 COPY NUMBER PER CELL 1.65 REFERENCE RANGE 1 of 3 Duplicate copy FINAL for DORAINE, SCHEXNIDER ([id number]) ADDITIONAL INFORMATION:(continued) NEGATIVE HER2/Chr17 Ratio <2.0 and Average HER2 copy number <4.0 EQUIVOCAL HER2/Chr17 Ratio <2.0 and Average HER2 copy number 4.0 and <6.0 POSITIVE HER2/Chr17 Ratio >=2.0 and/or Average HER2 copy number >=6.0 Pecola Leisure MD Pathologist, Electronic Signature ( Signed 02/01/2013) FINAL DIAGNOSIS Diagnosis Breast, biopsy, right - INVASIVE CARCINOMA. - SEE COMMENT. Microscopic Comment Given the clinical findings, the features are consistent with Grade III invasive mammary carcinoma. A prognostic  profile will be preformed and the results will be reported separately. (JBK:caf 01/31/13) Pecola Leisure MD Pathologist, Electronic Signature (Case signed 01/31/2013) Specimen Gross and Clinical Information Specimen Comment HX left breast cancer Specimen(s) Obtained: Breast, biopsy, right Specimen Clinical Information Inflammatory breast cancer Gross Received in formalin are two punch biopsies  ASSESSMENT    79 year old female with  #1 prior history of right breast cancer status post mastectomy but now with recurrence of disease subcutaneously with inflammatory breast cancer. Patient had 2 punch biopsies performed that revealed malignancy consistent with a primary breast cancer tumor is ER positive PR positive HER-2 negative Ki-67 was elevated at 68%. Patient is now seen in medical oncology for discussion of neoadjuvant therapy. She is accompanied by her son. We discussed her pathology we discussed different treatment options and prognosis.   #2 we discussed staging studies including getting CT scans of the chest abdomen and pelvis as well as the head. Rationale for this was discussed.  #3 we discussed neoadjuvant therapy with antiestrogen therapy such as Aromasin. We also discussed clinical trial BOLERO. She met with the clinical nurse. However we do need to get staging scans performed prior to her enrollment.  Clinical Trial Eligibility:yes Multidisciplinary conference discussion yes     PLAN:    #1 staging studies to rule out any metastatic disease.  #2 possibility of going on the BOLERO clinical study for Aromasin and affinitor  #3 she will return after first staging scans.       Thank you so much for allowing me to participate in the care of SHANEQUE MERKLE. I will continue to follow up the patient with you and assist in her care.  All questions were answered. The patient knows to call the clinic with any problems, questions or concerns. We can certainly see the patient  much sooner if necessary.  I spent 55 minutes counseling the patient face to face. The total time spent in the appointment was 60 minutes.   Drue Second, MD Medical/Oncology [facility] [phone] (beeper) [phone] (Office)  02/10/2013, 4:01 PM

## 2013-02-10 NOTE — Progress Notes (Signed)
Checked in new pt with no financial concerns. °

## 2013-02-15 ENCOUNTER — Ambulatory Visit: Payer: Medicare Other | Attending: General Surgery | Admitting: Physical Therapy

## 2013-02-16 ENCOUNTER — Other Ambulatory Visit: Payer: Self-pay | Admitting: Emergency Medicine

## 2013-02-17 ENCOUNTER — Encounter (INDEPENDENT_AMBULATORY_CARE_PROVIDER_SITE_OTHER): Payer: Self-pay

## 2013-02-17 ENCOUNTER — Encounter (HOSPITAL_COMMUNITY): Payer: Self-pay | Admitting: Emergency Medicine

## 2013-02-17 ENCOUNTER — Emergency Department (HOSPITAL_COMMUNITY): Payer: Medicare Other

## 2013-02-17 ENCOUNTER — Ambulatory Visit (HOSPITAL_COMMUNITY): Payer: Medicare Other

## 2013-02-17 ENCOUNTER — Encounter (HOSPITAL_COMMUNITY): Admission: RE | Admit: 2013-02-17 | Payer: Medicare Other | Source: Ambulatory Visit

## 2013-02-17 ENCOUNTER — Inpatient Hospital Stay (HOSPITAL_COMMUNITY)
Admission: EM | Admit: 2013-02-17 | Discharge: 2013-02-23 | DRG: 180 | Disposition: A | Discharge status: Skilled Nursing Facility | Payer: Medicare Other | Attending: Internal Medicine | Admitting: Internal Medicine

## 2013-02-17 DIAGNOSIS — J9811 Atelectasis: Secondary | ICD-10-CM | POA: Diagnosis present

## 2013-02-17 DIAGNOSIS — R531 Weakness: Secondary | ICD-10-CM

## 2013-02-17 DIAGNOSIS — D630 Anemia in neoplastic disease: Secondary | ICD-10-CM | POA: Diagnosis present

## 2013-02-17 DIAGNOSIS — C782 Secondary malignant neoplasm of pleura: Secondary | ICD-10-CM | POA: Diagnosis present

## 2013-02-17 DIAGNOSIS — R5381 Other malaise: Secondary | ICD-10-CM | POA: Diagnosis present

## 2013-02-17 DIAGNOSIS — R634 Abnormal weight loss: Secondary | ICD-10-CM | POA: Diagnosis present

## 2013-02-17 DIAGNOSIS — I10 Essential (primary) hypertension: Secondary | ICD-10-CM | POA: Diagnosis present

## 2013-02-17 DIAGNOSIS — C50911 Malignant neoplasm of unspecified site of right female breast: Secondary | ICD-10-CM

## 2013-02-17 DIAGNOSIS — C50419 Malignant neoplasm of upper-outer quadrant of unspecified female breast: Secondary | ICD-10-CM | POA: Diagnosis present

## 2013-02-17 DIAGNOSIS — J9819 Other pulmonary collapse: Secondary | ICD-10-CM | POA: Diagnosis present

## 2013-02-17 DIAGNOSIS — J90 Pleural effusion, not elsewhere classified: Secondary | ICD-10-CM

## 2013-02-17 DIAGNOSIS — I517 Cardiomegaly: Secondary | ICD-10-CM | POA: Diagnosis present

## 2013-02-17 DIAGNOSIS — Z923 Personal history of irradiation: Secondary | ICD-10-CM

## 2013-02-17 DIAGNOSIS — J189 Pneumonia, unspecified organism: Secondary | ICD-10-CM | POA: Diagnosis present

## 2013-02-17 DIAGNOSIS — C50919 Malignant neoplasm of unspecified site of unspecified female breast: Secondary | ICD-10-CM

## 2013-02-17 DIAGNOSIS — J910 Malignant pleural effusion: Principal | ICD-10-CM | POA: Diagnosis present

## 2013-02-17 DIAGNOSIS — I890 Lymphedema, not elsewhere classified: Secondary | ICD-10-CM | POA: Diagnosis present

## 2013-02-17 DIAGNOSIS — E119 Type 2 diabetes mellitus without complications: Secondary | ICD-10-CM | POA: Diagnosis present

## 2013-02-17 DIAGNOSIS — Z9010 Acquired absence of unspecified breast and nipple: Secondary | ICD-10-CM

## 2013-02-17 DIAGNOSIS — Z66 Do not resuscitate: Secondary | ICD-10-CM | POA: Diagnosis present

## 2013-02-17 DIAGNOSIS — C50411 Malignant neoplasm of upper-outer quadrant of right female breast: Secondary | ICD-10-CM

## 2013-02-17 DIAGNOSIS — R0600 Dyspnea, unspecified: Secondary | ICD-10-CM

## 2013-02-17 DIAGNOSIS — I972 Postmastectomy lymphedema syndrome: Secondary | ICD-10-CM | POA: Diagnosis present

## 2013-02-17 LAB — CBC WITH DIFFERENTIAL/PLATELET
Basophils Absolute: 0 10*3/uL (ref 0.0–0.1)
Basophils Relative: 1 % (ref 0–1)
HCT: 32.8 % — ABNORMAL LOW (ref 36.0–46.0)
MCHC: 33.2 g/dL (ref 30.0–36.0)
Monocytes Absolute: 0.7 10*3/uL (ref 0.1–1.0)
Neutro Abs: 3.5 10*3/uL (ref 1.7–7.7)
Platelets: 391 10*3/uL (ref 150–400)
RDW: 15.4 % (ref 11.5–15.5)

## 2013-02-17 LAB — URINALYSIS, ROUTINE W REFLEX MICROSCOPIC
Glucose, UA: NEGATIVE mg/dL
Ketones, ur: NEGATIVE mg/dL
Leukocytes, UA: NEGATIVE
Nitrite: NEGATIVE
Protein, ur: NEGATIVE mg/dL
pH: 5 (ref 5.0–8.0)

## 2013-02-17 LAB — COMPREHENSIVE METABOLIC PANEL
Albumin: 2.3 g/dL — ABNORMAL LOW (ref 3.5–5.2)
Alkaline Phosphatase: 80 U/L (ref 39–117)
BUN: 21 mg/dL (ref 6–23)
Creatinine, Ser: 1.39 mg/dL — ABNORMAL HIGH (ref 0.50–1.10)
GFR calc Af Amer: 41 mL/min — ABNORMAL LOW (ref >90)
Glucose, Bld: 137 mg/dL — ABNORMAL HIGH (ref 70–99)
Potassium: 4.6 mEq/L (ref 3.5–5.1)
Total Bilirubin: 0.2 mg/dL — ABNORMAL LOW (ref 0.3–1.2)
Total Protein: 6.7 g/dL (ref 6.0–8.3)

## 2013-02-17 LAB — PRO B NATRIURETIC PEPTIDE: Pro B Natriuretic peptide (BNP): 4967 pg/mL — ABNORMAL HIGH (ref 0–450)

## 2013-02-17 LAB — TROPONIN I: Troponin I: <0.3 ng/mL (ref <0.30)

## 2013-02-17 MED ORDER — DEXTROSE 5 % IV SOLN
1.0000 g | Freq: Once | INTRAVENOUS | Status: AC
  Administered 2013-02-17: 1 g via INTRAVENOUS
  Filled 2013-02-17: qty 10

## 2013-02-17 MED ORDER — FUROSEMIDE 10 MG/ML IJ SOLN
40.0000 mg | Freq: Once | INTRAMUSCULAR | Status: AC
  Administered 2013-02-17: 40 mg via INTRAVENOUS
  Filled 2013-02-17: qty 4

## 2013-02-17 MED ORDER — SODIUM CHLORIDE 0.9 % IJ SOLN
3.0000 mL | Freq: Two times a day (BID) | INTRAMUSCULAR | Status: DC
  Administered 2013-02-17 – 2013-02-23 (×10): 3 mL via INTRAVENOUS

## 2013-02-17 MED ORDER — ALBUTEROL SULFATE (5 MG/ML) 0.5% IN NEBU
2.5000 mg | INHALATION_SOLUTION | Freq: Four times a day (QID) | RESPIRATORY_TRACT | Status: DC | PRN
  Administered 2013-02-19 – 2013-02-21 (×3): 2.5 mg via RESPIRATORY_TRACT
  Filled 2013-02-17 (×3): qty 0.5

## 2013-02-17 MED ORDER — HEPARIN SODIUM (PORCINE) 5000 UNIT/ML IJ SOLN
5000.0000 [IU] | Freq: Three times a day (TID) | INTRAMUSCULAR | Status: DC
  Administered 2013-02-17 – 2013-02-23 (×19): 5000 [IU] via SUBCUTANEOUS
  Filled 2013-02-17 (×21): qty 1

## 2013-02-17 MED ORDER — IPRATROPIUM BROMIDE 0.02 % IN SOLN
0.5000 mg | Freq: Four times a day (QID) | RESPIRATORY_TRACT | Status: DC | PRN
  Administered 2013-02-19 – 2013-02-21 (×3): 0.5 mg via RESPIRATORY_TRACT
  Filled 2013-02-17 (×3): qty 2.5

## 2013-02-17 MED ORDER — SIMVASTATIN 20 MG PO TABS
20.0000 mg | ORAL_TABLET | Freq: Every evening | ORAL | Status: DC
  Administered 2013-02-17 – 2013-02-23 (×7): 20 mg via ORAL
  Filled 2013-02-17 (×8): qty 1

## 2013-02-17 MED ORDER — PREDNISONE 20 MG PO TABS
60.0000 mg | ORAL_TABLET | Freq: Once | ORAL | Status: AC
  Administered 2013-02-17: 60 mg via ORAL
  Filled 2013-02-17: qty 3

## 2013-02-17 MED ORDER — HYDROCODONE-ACETAMINOPHEN 5-325 MG PO TABS
1.0000 | ORAL_TABLET | ORAL | Status: DC | PRN
  Administered 2013-02-18 – 2013-02-21 (×11): 1 via ORAL
  Filled 2013-02-17 (×11): qty 1

## 2013-02-17 MED ORDER — PANTOPRAZOLE SODIUM 40 MG PO TBEC
40.0000 mg | DELAYED_RELEASE_TABLET | Freq: Every day | ORAL | Status: DC
  Administered 2013-02-17 – 2013-02-23 (×7): 40 mg via ORAL
  Filled 2013-02-17 (×7): qty 1

## 2013-02-17 MED ORDER — AZITHROMYCIN 500 MG PO TABS
500.0000 mg | ORAL_TABLET | Freq: Every day | ORAL | Status: DC
  Administered 2013-02-18 – 2013-02-22 (×5): 500 mg via ORAL
  Filled 2013-02-17 (×5): qty 1

## 2013-02-17 MED ORDER — AZITHROMYCIN 250 MG PO TABS
500.0000 mg | ORAL_TABLET | Freq: Once | ORAL | Status: AC
  Administered 2013-02-17: 500 mg via ORAL
  Filled 2013-02-17: qty 2

## 2013-02-17 MED ORDER — DEXTROSE 5 % IV SOLN
1.0000 g | INTRAVENOUS | Status: DC
  Administered 2013-02-18 – 2013-02-22 (×5): 1 g via INTRAVENOUS
  Filled 2013-02-17 (×5): qty 10

## 2013-02-17 MED ORDER — HYDROCODONE-ACETAMINOPHEN 5-325 MG PO TABS
2.0000 | ORAL_TABLET | Freq: Once | ORAL | Status: AC
  Administered 2013-02-17: 2 via ORAL
  Filled 2013-02-17: qty 2

## 2013-02-17 MED ORDER — PREDNISONE 20 MG PO TABS
40.0000 mg | ORAL_TABLET | Freq: Every day | ORAL | Status: DC
  Administered 2013-02-18 – 2013-02-23 (×6): 40 mg via ORAL
  Filled 2013-02-17 (×8): qty 2

## 2013-02-17 NOTE — ED Notes (Signed)
[id number]<AV> Expected date:<BR> Expected time:<BR> Means of arrival:Ambulance<BR> Comments:<BR> SOB

## 2013-02-17 NOTE — Progress Notes (Signed)
  Echocardiogram 2D Echocardiogram has been performed.  [name] 02/17/2013, 2:23 PM

## 2013-02-17 NOTE — ED Notes (Signed)
Pt verbalize understanding that urine sample is needed. States that she does not have to go right now but will try to go as soon as possible.

## 2013-02-17 NOTE — ED Notes (Signed)
Per Ems-pt 10 albuterol, 125 solu-medrol, .5 Atrovent. Breat cancer pt, chemo today. No O2 at home. Sats originally 79% upon arrival. Also report wheezing.

## 2013-02-17 NOTE — ED Provider Notes (Addendum)
History     CSN: [id number]  Arrival date & time 02/17/13  0902   First MD Initiated Contact with Patient 02/17/13 [phone]      Chief Complaint  Patient presents with  . Respiratory Distress    (Consider location/radiation/quality/duration/timing/severity/associated sxs/prior treatment) Patient is a 79 y.o. female presenting with shortness of breath. The history is provided by the patient.  Shortness of Breath Severity:  Severe Onset quality:  Gradual Duration:  1 month Timing:  Intermittent Progression:  Worsening Chronicity:  Recurrent Context: activity   Relieved by:  Inhaler, rest and sitting up Worsened by:  Activity (lying down) Associated symptoms: cough, sputum production and wheezing   Associated symptoms: no abdominal pain, no chest pain, no diaphoresis, no fever, no hemoptysis and no vomiting   Risk factors: hx of cancer and tobacco use   Risk factors: no family hx of DVT, no hx of PE/DVT and no recent surgery   Risk factors comment:  States past hx of left breast ca s/p radical mastectomy and now she states has Ca in the right breast and not started treatement yet   Past Medical History  Diagnosis Date  . Hypertension   . Diabetes mellitus without complication   . Breast cancer     Past Surgical History  Procedure Laterality Date  . Breast surgery      No family history on file.  History  Substance Use Topics  . Smoking status: Current Every Day Smoker  . Smokeless tobacco: Not on file  . Alcohol Use: No    OB History   Grav Para Term Preterm Abortions TAB SAB Ect Mult Living                  Review of Systems  Constitutional: Negative for fever and diaphoresis.  Respiratory: Positive for cough, sputum production, shortness of breath and wheezing. Negative for hemoptysis.   Cardiovascular: Negative for chest pain.  Gastrointestinal: Negative for vomiting and abdominal pain.  Musculoskeletal:       Left arm swelling for the last 2-3 months.    Skin:       Rash over the right breast for the last few months  All other systems reviewed and are negative.    Allergies  Review of patient's allergies indicates no known allergies.  Home Medications   Current Outpatient Rx  Name  Route  Sig  Dispense  Refill  . amLODipine (NORVASC) 5 MG tablet   Oral   Take 5 mg by mouth daily.         . clotrimazole-betamethasone (LOTRISONE) cream      Apply to affected area 2 times daily prn   45 g   0   . cyclobenzaprine (FLEXERIL) 10 MG tablet   Oral   Take 10 mg by mouth 3 (three) times daily as needed.         Marland Kitchen glipiZIDE (GLUCOTROL) 10 MG tablet   Oral   Take 10 mg by mouth 2 (two) times daily before a meal. Takes 2 in am and 1 in evening         . letrozole (FEMARA) 2.5 MG tablet   Oral   Take 1 tablet (2.5 mg total) by mouth daily.   90 tablet   6   . nystatin (MYCOSTATIN) powder   Topical   Apply topically 4 (four) times daily.   15 g   0   . Paliperidone Palmitate (INVEGA SUSTENNA IM)   Intramuscular   Inject  into the muscle every 30 (thirty) days. Injections once monthly at [place] [facility] ([facility]), last given on 02/01/2013.         . pantoprazole (PROTONIX) 40 MG tablet   Oral   Take 40 mg by mouth daily.         . simvastatin (ZOCOR) 20 MG tablet   Oral   Take 20 mg by mouth every evening.         . traMADol (ULTRAM) 50 MG tablet   Oral   Take 1 tablet (50 mg total) by mouth every 6 (six) hours as needed for pain.   60 tablet   0     BP 158/61  Pulse 98  Temp(Src) 98 F (36.7 C) (Oral)  SpO2 83%  Physical Exam  Nursing note and vitals reviewed. Constitutional: She is oriented to person, place, and time. She appears well-developed and well-nourished. No distress.  HENT:  Head: Normocephalic and atraumatic.  Mouth/Throat: Oropharynx is clear and moist.  Eyes: Conjunctivae and EOM are normal. Pupils are equal, round, and reactive to light.  Neck: Normal range  of motion. Neck supple.  Cardiovascular: Normal rate, regular rhythm and intact distal pulses.   No murmur heard. Pulmonary/Chest: Tachypnea noted. No respiratory distress. She has decreased breath sounds in the right lower field and the left lower field. She has wheezes. She has no rales.  Complete left sided mastecomy  Abdominal: Soft. She exhibits no distension. There is no tenderness. There is no rebound and no guarding.  Musculoskeletal: Normal range of motion. She exhibits edema. She exhibits no tenderness.  2+ pitting edema of the left upper ext with normal 2+ pulse.  Mild warmth and erythema.  No lower ext edema  Neurological: She is alert and oriented to person, place, and time.  Skin: Skin is warm and dry. Rash noted. No erythema. No pallor.     Psychiatric: She has a normal mood and affect. Her behavior is normal.    ED Course  Procedures (including critical care time)  Labs Reviewed  COMPREHENSIVE METABOLIC PANEL - Abnormal; Notable for the following:    Sodium 134 (*)    Glucose, Bld 137 (*)    Creatinine, Ser 1.39 (*)    Albumin 2.3 (*)    Total Bilirubin 0.2 (*)    GFR calc non Af Amer 35 (*)    GFR calc Af Amer 41 (*)    All other components within normal limits  PRO B NATRIURETIC PEPTIDE - Abnormal; Notable for the following:    Pro B Natriuretic peptide (BNP) 4967.0 (*)    All other components within normal limits  CBC WITH DIFFERENTIAL - Abnormal; Notable for the following:    RBC 3.75 (*)    Hemoglobin 10.9 (*)    HCT 32.8 (*)    Monocytes Relative 13 (*)    Eosinophils Relative 6 (*)    All other components within normal limits  TROPONIN I  URINALYSIS, ROUTINE W REFLEX MICROSCOPIC   Dg Chest 2 View  02/17/2013  *RADIOLOGY REPORT*  Clinical Data: Cough and shortness of breath.  Hypertension and diabetes.  CHEST - 2 VIEW  Comparison: CT 04/01/2011 and plain film 11/20/2009  Findings: Surgical clips within the left axilla.  Patient rotated left.  Cardiomegaly accentuated by AP portable technique.  Small to moderate left-sided pleural effusion. No pneumothorax.  Low lung volumes with resultant pulmonary interstitial prominence.  Left sided airspace disease which is new.  IMPRESSION: Left-sided pleural effusion  and adjacent infection or atelectasis. Recommend radiographic follow-up until clearing.  Cardiomegaly with low lung volumes on the frontal.  Interstitial prominence is favored to be secondary.  Concurrent mild pulmonary venous congestion is difficult to exclude.   Original Report Authenticated By: Jeronimo Greaves, M.D.      Date: 02/17/2013  Rate: 91  Rhythm: normal sinus rhythm  QRS Axis: right  Intervals: normal  ST/T Wave abnormalities: nonspecific T wave changes  Conduction Disutrbances:none  Narrative Interpretation:   Old EKG Reviewed: unchanged   1. CAP (community acquired pneumonia)       MDM    Patient complaining of shortness of breath it's worsened over the last one month but this morning acutely got worse.  When EMS arrived pt sating 79%. She also is an every day smoker and complains of a productive cough. Patient does use inhalers at home however did not use any today and states she was more short of breath today. Patient also has a concerning history of active cancer in her right breast and she has an appointment for followup today to start therapy but currently is on no chemotherapy medication.  This patient has diffuse wheezing on exam but no signs of fluid overload. She does have lymphedema in the left arm where she had her prior mastectomy.  She states shortness of breath is improving with albuterol and Atrovent nebs.  Concern for infectious etiology versus COPD exacerbation versus CHF versus PE.  Patient denies any chest pain, diaphoresis nausea or vomiting but symptoms are worse when she lays down and ambulates.  CBC, CMP, UA, BNP, chest x-ray pending.    11:04 AM Labs show an elevated BNP of 4000 without  old to compare but otherwise within normal limits. Chest x-ray shows a Left-sided pleural effusion and infection that is new from prior chest x-rays. Given patient's productive cough wheezing and worsening shortness of breath we'll treat with Rocephin and azithromycin. She's had no recent hospitalizations. Will admit for further care as she is requiring oxygen to maintain her O2 sats greater than 90%. She does appear to feel much better after her albuterol and Atrovent treatments.     Gwyneth Sprout, MD 02/17/13 1105  Gwyneth Sprout, MD 02/17/13 1119

## 2013-02-17 NOTE — H&P (Signed)
[facility] History and Physical  [name] WUJ:[id number] DOB: Jun 30, 1931 DOA: 02/17/2013  Referring physician: Dr. Anitra Lauth PCP: [name], MD  Specialists: none  Chief Complaint: shortness of breath  HPI: [name] is a 79 y.o. female has a past medical history significant for breast cancer status post resection in 2010 (left mastectomy status post radiation treatment is well), lost to followup until December of 2013 or ultrasound shows recurrence of her cancer on the right side. 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 She also has a right-sided skin thickening c/w inflammatory changes and also has been having left sided arm lymphedema for the past month.  For the last month she's been complaining of worsening shortness or breath and a productive cough. She saw her PCP about a month ago, and he gave her a prescription for amoxicillin for 10 days. She took the prescription without improvement in her symptoms.  She has been having more wheezing as well. She is using albuterol about twice a day, and she ran out of his medication last night. When she woke up this morning she felt short of breath (somewhat similar to her previous breathing difficulties) however she had no inhalers and her breathing difficulties haven't improved and she decided to call EMS. She denies any fevers or chills, she denies any chest pain. She has no lightheadedness or dizziness. Has no abdominal pain, nausea or vomiting. She denies any chest pain with deep inspiration. She endorses weight loss, unable to quantify how much.  Review of Systems:   Past Medical History  Diagnosis Date  . Hypertension   . Diabetes mellitus without complication   . Breast cancer    Past Surgical History  Procedure Laterality Date  . Breast surgery     Social History:  reports that she has been smoking.  She does not have any smokeless tobacco history on file. She reports that she does not  drink alcohol or use illicit drugs.  No Known Allergies  Family history noncontributory  Prior to Admission medications   Medication Sig Start Date End Date Taking? Authorizing Provider  albuterol (PROVENTIL HFA;VENTOLIN HFA) 108 (90 BASE) MCG/ACT inhaler Inhale 2 puffs into the lungs every 6 (six) hours as needed for wheezing.   Yes Historical Provider, MD  amLODipine (NORVASC) 5 MG tablet Take 5 mg by mouth daily.   Yes Historical Provider, MD  cyclobenzaprine (FLEXERIL) 10 MG tablet Take 10 mg by mouth 2 (two) times daily as needed for muscle spasms.    Yes Historical Provider, MD  glipiZIDE (GLUCOTROL) 10 MG tablet Take 10-20 mg by mouth 2 (two) times daily before a meal. Takes 2 in am and 1 in evening   Yes Historical Provider, MD  Paliperidone Palmitate (INVEGA SUSTENNA IM) Inject into the muscle every 30 (thirty) days. Injections once monthly at [place] (formerly [facility]), last given on 02/01/2013.   Yes Historical Provider, MD  pantoprazole (PROTONIX) 40 MG tablet Take 40 mg by mouth daily.   Yes Historical Provider, MD  simvastatin (ZOCOR) 20 MG tablet Take 20 mg by mouth every evening.   Yes Historical Provider, MD  traMADol (ULTRAM) 50 MG tablet Take 1 tablet (50 mg total) by mouth every 6 (six) hours as needed for pain. 01/27/13 01/27/14 Yes Almond Lint, MD   Physical Exam: Filed Vitals:   02/17/13 0904  BP: 158/61  Pulse: 98  Temp: 98 F (36.7 C)  TempSrc: Oral  SpO2: 83%  General:  NAD, comfortable appearing AA female  Eyes: PERRL, EOMI  ENT: moist oropharynx  Neck: supple, minimal JVD  Cardiovascular: RRR without MRG   Respiratory: good air movement, moderate wheezing bilaterally, decreased breath sounds at the left lung base  Abdomen: soft, non tender to palpation, positive bowel sounds, no guarding, no rebound  Skin: papular rash over left chest wall, non pruritis  Musculoskeletal: 1+ bilateral LE edema, swelling L arm > R arm with  changes c/w lymphedema  Psychiatric: normal mood and affect  Neurologic: CN 2-12 grossly intact, MS 5/5 in all 4  Labs on Admission:  Basic Metabolic Panel:  Recent Labs Lab 02/10/13 1502 02/17/13 0935  NA 137 134*  K 4.7 4.6  CL 105 99  CO2 20* 19  GLUCOSE 103* 137*  BUN 23.5 21  CREATININE 1.7* 1.39*  CALCIUM 9.4 8.8   Liver Function Tests:  Recent Labs Lab 02/10/13 1502 02/17/13 0935  AST 18 22  ALT 8 7  ALKPHOS 96 80  BILITOT 0.25 0.2*  PROT 7.4 6.7  ALBUMIN 2.3* 2.3*   CBC:  Recent Labs Lab 02/10/13 1502 02/17/13 0935  WBC 6.2 5.7  NEUTROABS 4.9 3.5  HGB 11.1* 10.9*  HCT 34.4* 32.8*  MCV 89.5 87.5  PLT 429* 391   Cardiac Enzymes:  Recent Labs Lab 02/17/13 0935  TROPONINI <0.30    BNP (last 3 results)  Recent Labs  02/17/13 0935  PROBNP 4967.0*   Radiological Exams on Admission: Dg Chest 2 View  02/17/2013  *RADIOLOGY REPORT*  Clinical Data: Cough and shortness of breath.  Hypertension and diabetes.  CHEST - 2 VIEW  Comparison: CT 04/01/2011 and plain film 11/20/2009  Findings: Surgical clips within the left axilla.  Patient rotated left. Cardiomegaly accentuated by AP portable technique.  Small to moderate left-sided pleural effusion. No pneumothorax.  Low lung volumes with resultant pulmonary interstitial prominence.  Left sided airspace disease which is new.  IMPRESSION: Left-sided pleural effusion and adjacent infection or atelectasis. Recommend radiographic follow-up until clearing.  Cardiomegaly with low lung volumes on the frontal.  Interstitial prominence is favored to be secondary.  Concurrent mild pulmonary venous congestion is difficult to exclude.   Original Report Authenticated By: Jeronimo Greaves, M.D.     EKG: Independently reviewed.  Assessment/Plan Active Problems:   Breast cancer, right breast   Lymphedema, left arm   Cancer of upper-outer quadrant of female breast   Shortness of breath due to Community-acquired pneumonia   - On Ceftriaxone/Azithromycin - Has significant wheezing, and the benefit from prednisone at this point.  - Duonebs every 6 hours. - can likely transition to levofloxacin tomorrow.   Cardiomegaly on chest x-ray - She has no known history of heart disease, - However this time her BNP is elevated and she has evidence of fluid overload with lower extremity edema minimal JVD and some pulmonary venous congestion the chest x-ray (which can also explain her wheezing not improving to antibiotics).  - 2-D echo to evaluate. - One dose of IV Lasix and repeat chest x-ray in the morning to re-evaluate left-sided pleural effusion. If effusion persistent, might need thoracentesis to evaluate further. Concerning factor is the malignancy recurrence and whether this is a malignant pleural effusion.  - troponin negative, EKG shows sinus rhythm.  Mild anemia - likely due to active malignancy  DVT Prophylaxis - heparin s.q.  Code Status: DNR/DNI  Family Communication: none  Disposition Plan: admit pending workup  Time spent: 25  Costin M. Elvera Lennox,  MD [facility] Pager [phone]  If 7PM-7AM, please contact night-coverage [email or web address] Password [facility] 02/17/2013, 11:44 AM

## 2013-02-17 NOTE — Progress Notes (Signed)
ANTIBIOTIC CONSULT NOTE - INITIAL  Pharmacy Consult for ceftriaxone Indication: pneumonia  No Known Allergies  Patient Measurements:   Adjusted Body Weight:   Vital Signs: Temp: 98 F (36.7 C) (04/10 0904) Temp src: Oral (04/10 0904) BP: 158/61 mmHg (04/10 0904) Pulse Rate: 98 (04/10 0904) Intake/Output from previous day:   Intake/Output from this shift:    Labs:  Recent Labs  02/17/13 0935  WBC 5.7  HGB 10.9*  PLT 391  CREATININE 1.39*   The CrCl is unknown because both a height and weight (above a minimum accepted value) are required for this calculation. No results found for this basename: VANCOTROUGH, VANCOPEAK, VANCORANDOM, GENTTROUGH, GENTPEAK, GENTRANDOM, TOBRATROUGH, TOBRAPEAK, TOBRARND, AMIKACINPEAK, AMIKACINTROU, AMIKACIN,  in the last 72 hours   Microbiology: No results found for this or any previous visit (from the past 720 hour(s)).  Medical History: Past Medical History  Diagnosis Date  . Hypertension   . Diabetes mellitus without complication   . Breast cancer     Assessment: 72 YOF presents with shortness of breath, orders to start CAP regimen ceftriaxone/azithromycin. 1st doses given in ED  Goal of Therapy:  Appropriately dose antibiotics  Plan:   Ceftriaxone 1gm IV q24h  No renal adjustment needed, pharmacy to follow at distance  Dannielle Huh 02/17/2013,11:55 AM

## 2013-02-18 ENCOUNTER — Ambulatory Visit (HOSPITAL_COMMUNITY): Payer: Medicare Other

## 2013-02-18 ENCOUNTER — Ambulatory Visit
Admit: 2013-02-18 | Discharge: 2013-02-18 | Disposition: A | Discharge status: Home / Self Care | Payer: Medicare Other | Attending: Radiation Oncology | Admitting: Radiation Oncology

## 2013-02-18 ENCOUNTER — Inpatient Hospital Stay (HOSPITAL_COMMUNITY): Payer: Medicare Other

## 2013-02-18 DIAGNOSIS — J9819 Other pulmonary collapse: Secondary | ICD-10-CM

## 2013-02-18 DIAGNOSIS — J9811 Atelectasis: Secondary | ICD-10-CM | POA: Diagnosis present

## 2013-02-18 DIAGNOSIS — J90 Pleural effusion, not elsewhere classified: Secondary | ICD-10-CM | POA: Diagnosis present

## 2013-02-18 HISTORY — DX: Pleural effusion, not elsewhere classified: J90

## 2013-02-18 HISTORY — DX: Atelectasis: J98.11

## 2013-02-18 LAB — CBC
MCV: 86.1 fL (ref 78.0–100.0)
Platelets: 473 10*3/uL — ABNORMAL HIGH (ref 150–400)
RDW: 15.1 % (ref 11.5–15.5)
WBC: 4 10*3/uL (ref 4.0–10.5)

## 2013-02-18 LAB — COMPREHENSIVE METABOLIC PANEL
AST: 16 U/L (ref 0–37)
Albumin: 2.5 g/dL — ABNORMAL LOW (ref 3.5–5.2)
Calcium: 9.3 mg/dL (ref 8.4–10.5)
Chloride: 98 mEq/L (ref 96–112)
Creatinine, Ser: 1.53 mg/dL — ABNORMAL HIGH (ref 0.50–1.10)
Total Bilirubin: 0.1 mg/dL — ABNORMAL LOW (ref 0.3–1.2)

## 2013-02-18 MED ORDER — LORAZEPAM 0.5 MG PO TABS
0.5000 mg | ORAL_TABLET | Freq: Once | ORAL | Status: AC
  Administered 2013-02-18: 0.5 mg via ORAL
  Filled 2013-02-18: qty 1

## 2013-02-18 NOTE — Progress Notes (Signed)
[facility] PROGRESS NOTE  [name]:[id number] DOB: 17-Nov-1930 DOA: 02/17/2013 PCP: [name], MD  Brief Narrative: [name] is a 79 y.o. female has a past medical history significant for breast cancer status post resection in 2010 (left mastectomy status post radiation treatment is well), lost to followup until December of 2013 or ultrasound shows recurrence of her cancer on the right side. 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 She also has a right-sided skin thickening c/w inflammatory changes and also has been having left sided arm lymphedema for the past month. For the last month she's been complaining of worsening shortness or breath and a productive cough. She saw her PCP about a month ago, and he gave her a prescription for amoxicillin for 10 days. She took the prescription without improvement in her symptoms. She has been having more wheezing as well. She is using albuterol about twice a day, and she ran out of his medication last night. When she woke up this morning she felt short of breath (somewhat similar to her previous breathing difficulties) however she had no inhalers and her breathing difficulties haven't improved and she decided to call EMS. She denies any fevers or chills, she denies any chest pain. She has no lightheadedness or dizziness. Has no abdominal pain, nausea or vomiting. She denies any chest pain with deep inspiration. She endorses weight loss, unable to quantify how much.  Assessment/Plan:  Shortness of breath due to Community-acquired pneumonia  - On Ceftriaxone/Azithromycin  - wheezing improved.  - Duonebs every 6 hours.   Left sided pleural effusion - CT scan today, read pending - I have consulted Pulm for consideration diagnostic and therapeutic thora  Cardiomegaly on chest x-ray  - She has no known history of heart disease, 2D echo unremarkable - troponin negative, EKG shows sinus rhythm.   Mild anemia   - likely due to active malignancy   DVT Prophylaxis  - heparin s.q.   Code Status: DNR Family Communication: none  Disposition Plan: unknown  Consultants:  Pulm  Procedures:  2D echo Study Conclusions  - Left ventricle: The cavity size was normal. Systolic function was normal. The estimated ejection fraction was in the range of 60% to 65%. Wall motion was normal; there were no regional wall motion abnormalities. Mitral valve: Mild regurgitation. Pericardium, extracardiac: A trivial pericardial effusion was identified.  Antibiotics:  Ceftriaxone/Azithromycin 4/10 >>  HPI/Subjective: - feels well this morning, denies shortness of breath.   Objective: Filed Vitals:   02/17/13 1330 02/17/13 2205 02/18/13 0551 02/18/13 1411  BP: 113/48 153/64 125/57 156/66  Pulse:  108 104 119  Temp: 98.7 F (37.1 C) 98.8 F (37.1 C) 98.2 F (36.8 C) 98.5 F (36.9 C)  TempSrc: Oral Oral Oral Oral  Resp: 14  16 18   Height: 5\' 2"  (1.575 m)     Weight: 88.3 kg (194 lb 10.7 oz)  87.59 kg (193 lb 1.6 oz)   SpO2: 98% 96% 99% 98%    Intake/Output Summary (Last 24 hours) at 02/18/13 1442 Last data filed at 02/18/13 1300  Gross per 24 hour  Intake    723 ml  Output    625 ml  Net     98 ml   Filed Weights   02/17/13 1330 02/18/13 0551  Weight: 88.3 kg (194 lb 10.7 oz) 87.59 kg (193 lb 1.6 oz)    Exam:  General:  NAD  Cardiovascular: regular rate and rhythm, without MRG  Respiratory: good air  movement, no wheezing this morning, decreased breath sounds on left lower lung  Abdomen: soft, not tender to palpation, positive bowel sounds  MSK: trace peripheral edema; left arm swollen, unchanged since last night  Neuro: CN 2-12 grossly intact, MS 5/5 in all 4  Data Reviewed: Basic Metabolic Panel:  Recent Labs Lab 02/17/13 0935 02/18/13 0430  NA 134* 134*  K 4.6 4.3  CL 99 98  CO2 19 26  GLUCOSE 137* 213*  BUN 21 30*  CREATININE 1.39* 1.53*  CALCIUM 8.8 9.3   Liver  Function Tests:  Recent Labs Lab 02/17/13 0935 02/18/13 0430  AST 22 16  ALT 7 7  ALKPHOS 80 80  BILITOT 0.2* 0.1*  PROT 6.7 6.8  ALBUMIN 2.3* 2.5*   CBC:  Recent Labs Lab 02/17/13 0935 02/18/13 0430  WBC 5.7 4.0  NEUTROABS 3.5  --   HGB 10.9* 10.3*  HCT 32.8* 31.7*  MCV 87.5 86.1  PLT 391 473*   Cardiac Enzymes:  Recent Labs Lab 02/17/13 0935  TROPONINI <0.30   BNP (last 3 results)  Recent Labs  02/17/13 0935  PROBNP 4967.0*   Studies: Dg Chest 2 View  02/17/2013  *RADIOLOGY REPORT*  Clinical Data: Cough and shortness of breath.  Hypertension and diabetes.  CHEST - 2 VIEW  Comparison: CT 04/01/2011 and plain film 11/20/2009  Findings: Surgical clips within the left axilla.  Patient rotated left. Cardiomegaly accentuated by AP portable technique.  Small to moderate left-sided pleural effusion. No pneumothorax.  Low lung volumes with resultant pulmonary interstitial prominence.  Left sided airspace disease which is new.  IMPRESSION: Left-sided pleural effusion and adjacent infection or atelectasis. Recommend radiographic follow-up until clearing.  Cardiomegaly with low lung volumes on the frontal.  Interstitial prominence is favored to be secondary.  Concurrent mild pulmonary venous congestion is difficult to exclude.   Original Report Authenticated By: Jeronimo Greaves, M.D.    Portable Chest 1 View  02/18/2013  *RADIOLOGY REPORT*  Clinical Data: Pleural effusion.  PORTABLE CHEST - 1 VIEW  Comparison: 02/17/2013.  Findings: Trachea is midline.  Heart size grossly stable. Bihilar prominence.  Left basilar collapse/consolidation and left pleural effusion appear unchanged. Postoperative changes of left mastectomy.  Surgical clips in the left axilla.  IMPRESSION:  Left lower lobe collapse/consolidation and left pleural effusion with left hilar prominence.  A centrally obstructing mass with postobstructive pneumonitis/pneumonia could have this appearance. CT chest with contrast  would be helpful in further evaluation, as clinically indicated.   Original Report Authenticated By: Leanna Battles, M.D.    Scheduled Meds: . azithromycin  500 mg Oral Daily  . cefTRIAXone (ROCEPHIN) IVPB 1 gram/50 mL D5W  1 g Intravenous Q24H  . heparin  5,000 Units Subcutaneous Q8H  . pantoprazole  40 mg Oral Daily  . predniSONE  40 mg Oral Q breakfast  . simvastatin  20 mg Oral QPM  . sodium chloride  3 mL Intravenous Q12H   Continuous Infusions:   Active Problems:   Breast cancer, right breast   Lymphedema, left arm   Cancer of upper-outer quadrant of female breast  Time spent: 35  Pamella Pert, MD [facility] Pager [phone]. If 7 PM - 7 AM, please contact night-coverage at [email or web address], password [facility] 02/18/2013, 2:42 PM  LOS: 1 day

## 2013-02-18 NOTE — Consult Note (Signed)
PULMONARY  / CRITICAL CARE MEDICINE  Name: [name] MRN: [id number] DOB: 1930-12-17    ADMISSION DATE:  02/17/2013 CONSULTATION DATE:  02/18/2013   REFERRING MD :  [facility] PRIMARY SERVICE:  TRH  CHIEF COMPLAINT:  Left pleural effusion  BRIEF PATIENT DESCRIPTION: this is a 79 year old African American female with history of breast cancer status post resection 2010 now with recurrent disease December 2013 in progression in the chest 2014. pulmonary asked to assist in evaluation of left pleural effusion  SIGNIFICANT EVENTS / STUDIES:  CT scan reveals pleural metastases and pleural effusion and left lower lobe collapse  LINES / TUBES: PIV  CULTURES: none  ANTIBIOTICS: Rocephin 4/10 Azithromycin 4/10  HISTORY OF PRESENT ILLNESS:  79 year old female with prior history of breast cancer status post resection 2010 then had recurrence in December 2013 with progression of ductal carcinoma. The patient is DO NOT INTUBATE DO NOT RESUSCITATE. He's had increasing wheezing and shortness of breath and was admitted for increasing respiratory distress on 02/17/2013. Pulmonary was asked to consult on the question of left pleural effusion whether a thoracentesis would be beneficial  PAST MEDICAL HISTORY :  Past Medical History  Diagnosis Date  . Hypertension   . Diabetes mellitus without complication   . Breast cancer    Past Surgical History  Procedure Laterality Date  . Breast surgery     Prior to Admission medications   Medication Sig Start Date End Date Taking? Authorizing Provider  albuterol (PROVENTIL HFA;VENTOLIN HFA) 108 (90 BASE) MCG/ACT inhaler Inhale 2 puffs into the lungs every 6 (six) hours as needed for wheezing.   Yes Historical Provider, MD  amLODipine (NORVASC) 5 MG tablet Take 5 mg by mouth daily.   Yes Historical Provider, MD  cyclobenzaprine (FLEXERIL) 10 MG tablet Take 10 mg by mouth 2 (two) times daily as needed for muscle spasms.    Yes Historical Provider, MD   glipiZIDE (GLUCOTROL) 10 MG tablet Take 10-20 mg by mouth 2 (two) times daily before a meal. Takes 2 in am and 1 in evening   Yes Historical Provider, MD  Paliperidone Palmitate (INVEGA SUSTENNA IM) Inject into the muscle every 30 (thirty) days. Injections once monthly at [facility] (formerly [facility]), last given on 02/01/2013.   Yes Historical Provider, MD  pantoprazole (PROTONIX) 40 MG tablet Take 40 mg by mouth daily.   Yes Historical Provider, MD  simvastatin (ZOCOR) 20 MG tablet Take 20 mg by mouth every evening.   Yes Historical Provider, MD  traMADol (ULTRAM) 50 MG tablet Take 1 tablet (50 mg total) by mouth every 6 (six) hours as needed for pain. 01/27/13 01/27/14 Yes Almond Lint, MD   No Known Allergies  FAMILY HISTORY:  History reviewed. No pertinent family history. SOCIAL HISTORY:  reports that she has been smoking.  She has never used smokeless tobacco. She reports that she does not drink alcohol or use illicit drugs.  REVIEW OF SYSTEMS:   Constitutional: Negative for fever, chills, weight loss, malaise/fatigue and diaphoresis.  HENT: Negative for hearing loss, ear pain, nosebleeds, congestion, sore throat, neck pain, tinnitus and ear discharge.   Eyes: Negative for blurred vision, double vision, photophobia, pain, discharge and redness.  Respiratory: Negative for cough, hemoptysis, sputum production, shortness of breath, wheezing and stridor.   Cardiovascular: Negative for chest pain, palpitations, orthopnea, claudication, leg swelling and PND.  Gastrointestinal: Negative for heartburn, nausea, vomiting, abdominal pain, diarrhea, constipation, blood in stool and melena.  Genitourinary: Negative for dysuria, urgency,  frequency, hematuria and flank pain.  Musculoskeletal: Negative for myalgias, back pain, joint pain and falls.  Skin: Negative for itching and rash.  Neurological: Negative for dizziness, tingling, tremors, sensory change, speech change, focal  weakness, seizures, loss of consciousness, weakness and headaches.  Endo/Heme/Allergies: Negative for environmental allergies and polydipsia. Does not bruise/bleed easily.  SUBJECTIVE:   VITAL SIGNS: Temp:  [98.2 F (36.8 C)-98.8 F (37.1 C)] 98.5 F (36.9 C) (04/11 1411) Pulse Rate:  [104-119] 119 (04/11 1411) Resp:  [16-18] 18 (04/11 1411) BP: (125-156)/(57-66) 156/66 mmHg (04/11 1411) SpO2:  [96 %-99 %] 98 % (04/11 1411) Weight:  [87.59 kg (193 lb 1.6 oz)] 87.59 kg (193 lb 1.6 oz) (04/11 0551)  PHYSICAL EXAMINATION: General:  Ill-appearing African American female in no acute distress Neuro:  Awake and alert HEENT:  No jugular venous distention Neck:  supple Cardiovascular:  Regular rate and rhythm normal S1-S2 no S3-S4 Lungs:  Decreased breath sounds halfway up on the left Abdomen:  Soft nontender bowel sounds active Musculoskeletal:  Full range of motion no joint deformity left upper extremity edematous with lymphedema Skin:  clear   Recent Labs Lab 02/17/13 0935 02/18/13 0430  NA 134* 134*  K 4.6 4.3  CL 99 98  CO2 19 26  BUN 21 30*  CREATININE 1.39* 1.53*  GLUCOSE 137* 213*    Recent Labs Lab 02/17/13 0935 02/18/13 0430  HGB 10.9* 10.3*  HCT 32.8* 31.7*  WBC 5.7 4.0  PLT 391 473*   Dg Chest 2 View  02/17/2013  *RADIOLOGY REPORT*  Clinical Data: Cough and shortness of breath.  Hypertension and diabetes.  CHEST - 2 VIEW  Comparison: CT 04/01/2011 and plain film 11/20/2009  Findings: Surgical clips within the left axilla.  Patient rotated left. Cardiomegaly accentuated by AP portable technique.  Small to moderate left-sided pleural effusion. No pneumothorax.  Low lung volumes with resultant pulmonary interstitial prominence.  Left sided airspace disease which is new.  IMPRESSION: Left-sided pleural effusion and adjacent infection or atelectasis. Recommend radiographic follow-up until clearing.  Cardiomegaly with low lung volumes on the frontal.  Interstitial  prominence is favored to be secondary.  Concurrent mild pulmonary venous congestion is difficult to exclude.   Original Report Authenticated By: Jeronimo Greaves, M.D.    Ct Chest Wo Contrast  02/18/2013  *RADIOLOGY REPORT*  Clinical Data: Shortness of breath.  Pleural effusion.  History of left-sided breast cancers status post left mastectomy, chemotherapy and radiation therapy now complete.  CT CHEST WITHOUT CONTRAST  Technique:  Multidetector CT imaging of the chest was performed following the standard protocol without IV contrast.  Comparison: Chest CT 04/01/2011.  Findings:  Mediastinum: Extensive anterior mediastinal lymphadenopathy is now noted, with the largest lymph nodes measuring up to 1.4-1.7 cm in short axis. Enlarged lymph nodes are also noted the superior mediastinum immediately anterior to the origin of the left common carotid artery measuring up to 1.5 cm in short axis.  Heart size is normal. There is no significant pericardial fluid, thickening or pericardial calcification. There is atherosclerosis of the thoracic aorta, the great vessels of the mediastinum and the coronary arteries, including calcified atherosclerotic plaque in the left main, left anterior descending and right coronary arteries. Esophagus is unremarkable in appearance.  Lungs/Pleura: Although difficult to assess for certain on the noncontrast CT examination, there appears to be multifocal pleural- based nodularity in the left hemithorax, best demonstrated on image 39 of series 2 where there is an apparent 2.6 x 1.4 cm pleural  based nodule in the medial aspect of the left hemithorax.  There is a new moderate left sided pleural effusion which is likely malignant.  Extensive passive atelectasis in the left lower lobe which obscures assessment of the lung parenchyma for underlying pulmonary nodules.  Subpleural reticulation in the anterolateral aspect of the left upper lobe is likely related to a prior left- sided breast radiation  therapy.  No definite suspicious appearing pulmonary nodules or masses are noted within the aerated portions of the lungs, although there are a few scattered 1-2 mm pulmonary nodules which are highly nonspecific.  Upper Abdomen: Soft tissue in the upper retroperitoneum immediately anterolateral to the aorta (image 59 of series 2) measuring 3.0 x 2.1 cm, suspicious for lymphadenopathy.  Additional smaller soft tissue lesions are also noted throughout the visualized upper abdomen.  Extensive edema is noted throughout the left upper abdominal wall anteriorly and laterally.  Musculoskeletal: Status post left modified radical mastectomy and axillary nodal dissection.  There is extensive skin thickening throughout the right breast, and edema throughout the right breast as well as the chest wall bilaterally.  In the right axilla there are numerous enlarged lymph nodes, including the largest node which is 4.7 x 3.8 cm.  Extensive subpectoral lymphadenopathy is also noted.  There are no aggressive appearing lytic or blastic lesions noted in the visualized portions of the skeleton.  IMPRESSION: 1.  Findings, as above, highly concerning for possible inflammatory breast cancer in the right breast, with extensive right axillary, subpectoral, mediastinal and retroperitoneal lymphadenopathy. There is also likely a malignant left pleural effusion. 2. Atherosclerosis, including left main and two-vessel coronary artery disease. 3.  Additional incidental findings, as above.  These results were called by telephone on 02/18/2013 at 03:20 p.m. to Dr. Elvera Lennox, who verbally acknowledged these results.   Original Report Authenticated By: Trudie Reed, M.D.    Portable Chest 1 View  02/18/2013  *RADIOLOGY REPORT*  Clinical Data: Pleural effusion.  PORTABLE CHEST - 1 VIEW  Comparison: 02/17/2013.  Findings: Trachea is midline.  Heart size grossly stable. Bihilar prominence.  Left basilar collapse/consolidation and left pleural effusion  appear unchanged. Postoperative changes of left mastectomy.  Surgical clips in the left axilla.  IMPRESSION:  Left lower lobe collapse/consolidation and left pleural effusion with left hilar prominence.  A centrally obstructing mass with postobstructive pneumonitis/pneumonia could have this appearance. CT chest with contrast would be helpful in further evaluation, as clinically indicated.   Original Report Authenticated By: Leanna Battles, M.D.     ASSESSMENT / PLAN:  #1 progressive collapse left lower lobe with consolidation and left pleural effusion left hilar prominence with central obstructing mass in the left lower lobe and volume loss left lower lobe. This pleural effusion is not in the patient's collapse of the lung but rather is compensatory. During this pleural effusion would not be beneficial in this patient's case  Plan Consider hospice care in this patient and palliation No thoracentesis is indicated Pulmonary will see again as needed  Caryl Bis  [phone]  Cell  [phone]  If no response or cell goes to voicemail, call beeper [phone]  Pulmonary and Critical Care Medicine [facility] Pager: [phone]  02/18/2013, 5:32 PM

## 2013-02-18 NOTE — Progress Notes (Signed)
Inpatient Diabetes Program Recommendations  AACE/ADA: New Consensus Statement on Inpatient Glycemic Control (2013)  Target Ranges:  Prepandial:   less than 140 mg/dL      Peak postprandial:   less than 180 mg/dL (1-2 hours)      Critically ill patients:  140 - 180 mg/dL   Results for MYCHAELA, LENNARTZ (MRN [id number]) as of 02/18/2013 11:00  Ref. Range 02/17/2013 09:35 02/18/2013 04:30  Glucose Latest Range: 70-99 mg/dl 409 (H) 811 (H)   Results for PARTICIA, STRAHM (MRN [id number]) as of 02/18/2013 11:00  Ref. Range 11/21/2009 06:00  Hemoglobin A1C Latest Range: 4.6-6.1 % 6.1.Marland KitchenMarland Kitchen   Inpatient Diabetes Program Recommendations Correction (SSI): Please consider ordering CBGs ACHS with Novolog correction scale. Diet: Please consider adding diabetic to the current heart healthy diet.  Note: Patient has a history of diabetes and takes Glipizide 20 mg QAM and Glipizide 10 mg Qevening at home for diabetes management.  Currently, patient is not ordered to receive any medications for inpatient glycemic control.  Patient is also on Prednisone 40mg  QAM.  Please consider ordering CBGs ACHS with Novolog correction scale and consider adding diabetic diet to current heart healthy diet.  Also, may want to consider ordering an A1C to determine glycemic control over the last 2-3 months (if appropriate).  Will continue to follow as an inpatient.  Thanks, Orlando Penner, RN, BSN, CCRN Diabetes Coordinator Inpatient Diabetes Program [phone]

## 2013-02-18 NOTE — Care Management Note (Signed)
UR complete 

## 2013-02-19 DIAGNOSIS — J910 Malignant pleural effusion: Principal | ICD-10-CM

## 2013-02-19 LAB — BASIC METABOLIC PANEL
BUN: 33 mg/dL — ABNORMAL HIGH (ref 6–23)
CO2: 24 mEq/L (ref 19–32)
Calcium: 9.4 mg/dL (ref 8.4–10.5)
Creatinine, Ser: 1.48 mg/dL — ABNORMAL HIGH (ref 0.50–1.10)
Glucose, Bld: 119 mg/dL — ABNORMAL HIGH (ref 70–99)

## 2013-02-19 LAB — CBC
Hemoglobin: 10.6 g/dL — ABNORMAL LOW (ref 12.0–15.0)
MCH: 28.2 pg (ref 26.0–34.0)
MCV: 85.4 fL (ref 78.0–100.0)
RBC: 3.76 MIL/uL — ABNORMAL LOW (ref 3.87–5.11)

## 2013-02-19 MED ORDER — ALPRAZOLAM 0.25 MG PO TABS
0.2500 mg | ORAL_TABLET | Freq: Once | ORAL | Status: AC
  Administered 2013-02-19: 0.25 mg via ORAL
  Filled 2013-02-19: qty 1

## 2013-02-19 NOTE — Progress Notes (Signed)
Thank you for consulting the Palliative Medicine Team at [facility] to meet your patient's and family's needs.   The reason that you asked [place] to see your patient is  For Goals of Care  We have scheduled your patient for a meeting: 02-20-13 at 200pm with Lorinda Creed NP  The Surrogate decision make is: Kerilyn Cortner  Contact information: [phone]  Other family members that need to be present:     Your patient is able/unable to participate:yes    Lorinda Creed NP  Palliative Medicine Team Team Phone # [phone] Pager [phone]

## 2013-02-19 NOTE — Progress Notes (Signed)
[facility] PROGRESS NOTE  TIFFANI KADOW ZOX:[id number] DOB: [birth year] DOA: 02/17/2013 PCP: [name], MD  Brief Narrative: [name] is a 79 y.o. female has a past medical history significant for breast cancer status post resection in 2010 (left mastectomy status post radiation treatment is well), lost to followup until December of 2013 or ultrasound shows recurrence of her cancer on the right side. 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 She also has a right-sided skin thickening c/w inflammatory changes and also has been having left sided arm lymphedema for the past month. For the last month she's been complaining of worsening shortness or breath and a productive cough. She saw her PCP about a month ago, and he gave her a prescription for amoxicillin for 10 days. She took the prescription without improvement in her symptoms. She has been having more wheezing as well. She is using albuterol about twice a day, and she ran out of his medication last night. When she woke up this morning she felt short of breath (somewhat similar to her previous breathing difficulties) however she had no inhalers and her breathing difficulties haven't improved and she decided to call EMS. She denies any fevers or chills, she denies any chest pain. She has no lightheadedness or dizziness. Has no abdominal pain, nausea or vomiting. She denies any chest pain with deep inspiration. She endorses weight loss, unable to quantify how much.  Assessment/Plan:  Breast cancer - With recurrence on the right. Radiation oncology has been consulted - I think at this point, he would not be unreasonable to consult palliative team. I will try to get in touch with her primary oncologist before calling palliative.  Shortness of breath due to Community-acquired pneumonia vs malignant pleural effusion - On Ceftriaxone/Azithromycin  - wheezing improved.  - Duonebs every 6 hours.   Left sided  pleural effusion - Most likely malignant effusion due to known cancer. - Consult to pulmonology yesterday; did not feel like a thoracentesis would be beneficial at this point  Cardiomegaly on chest x-ray  - She has no known history of heart disease, 2D echo unremarkable - troponin negative, EKG shows sinus rhythm.   Mild anemia  - likely due to active malignancy   DVT Prophylaxis  - heparin s.q.   Code Status: DNR Family Communication: none  Disposition Plan: Skilled nursing facility early next week  Consultants:  Pulmonology  Procedures:  2D echo Study Conclusions - Left ventricle: The cavity size was normal. Systolic function was normal. The estimated ejection fraction was in the range of 60% to 65%. Wall motion was normal; there were no regional wall motion abnormalities. Mitral valve: Mild regurgitation. Pericardium, extracardiac: A trivial pericardial effusion was identified.  Antibiotics:  Ceftriaxone/Azithromycin 4/10 >>  HPI/Subjective: - feels well this morning, denies shortness of breath.   Objective: Filed Vitals:   02/18/13 0551 02/18/13 1411 02/18/13 2229 02/19/13 0611  BP: 125/57 156/66 141/64 160/60  Pulse: 104 119 117 113  Temp: 98.2 F (36.8 C) 98.5 F (36.9 C) 99.1 F (37.3 C) 98.6 F (37 C)  TempSrc: Oral Oral Oral Oral  Resp: 16 18 18 18   Height:      Weight: 87.59 kg (193 lb 1.6 oz)   87.363 kg (192 lb 9.6 oz)  SpO2: 99% 98% 97% 95%    Intake/Output Summary (Last 24 hours) at 02/19/13 0743 Last data filed at 02/19/13 0429  Gross per 24 hour  Intake    533 ml  Output  950 ml  Net   -417 ml   Filed Weights   02/17/13 1330 02/18/13 0551 02/19/13 0611  Weight: 88.3 kg (194 lb 10.7 oz) 87.59 kg (193 lb 1.6 oz) 87.363 kg (192 lb 9.6 oz)    Exam:  General:  NAD  Cardiovascular: regular rate and rhythm, without MRG  Respiratory: good air movement, no wheezing this morning, decreased breath sounds on left lower lung  Abdomen:  soft, not tender to palpation, positive bowel sounds  MSK: trace peripheral edema; left arm swollen, unchanged since last night  Neuro: CN 2-12 grossly intact, MS 5/5 in all 4  Data Reviewed: Basic Metabolic Panel:  Recent Labs Lab 02/17/13 0935 02/18/13 0430 02/19/13 0515  NA 134* 134* 135  K 4.6 4.3 4.4  CL 99 98 99  CO2 19 26 24   GLUCOSE 137* 213* 119*  BUN 21 30* 33*  CREATININE 1.39* 1.53* 1.48*  CALCIUM 8.8 9.3 9.4   Liver Function Tests:  Recent Labs Lab 02/17/13 0935 02/18/13 0430  AST 22 16  ALT 7 7  ALKPHOS 80 80  BILITOT 0.2* 0.1*  PROT 6.7 6.8  ALBUMIN 2.3* 2.5*   CBC:  Recent Labs Lab 02/17/13 0935 02/18/13 0430 02/19/13 0515  WBC 5.7 4.0 8.6  NEUTROABS 3.5  --   --   HGB 10.9* 10.3* 10.6*  HCT 32.8* 31.7* 32.1*  MCV 87.5 86.1 85.4  PLT 391 473* 452*   Cardiac Enzymes:  Recent Labs Lab 02/17/13 0935  TROPONINI <0.30   BNP (last 3 results)  Recent Labs  02/17/13 0935  PROBNP 4967.0*   Studies: Dg Chest 2 View  02/17/2013  *RADIOLOGY REPORT*  Clinical Data: Cough and shortness of breath.  Hypertension and diabetes.  CHEST - 2 VIEW  Comparison: CT 04/01/2011 and plain film 11/20/2009  Findings: Surgical clips within the left axilla.  Patient rotated left. Cardiomegaly accentuated by AP portable technique.  Small to moderate left-sided pleural effusion. No pneumothorax.  Low lung volumes with resultant pulmonary interstitial prominence.  Left sided airspace disease which is new.  IMPRESSION: Left-sided pleural effusion and adjacent infection or atelectasis. Recommend radiographic follow-up until clearing.  Cardiomegaly with low lung volumes on the frontal.  Interstitial prominence is favored to be secondary.  Concurrent mild pulmonary venous congestion is difficult to exclude.   Original Report Authenticated By: Jeronimo Greaves, M.D.    Ct Chest Wo Contrast  02/18/2013  *RADIOLOGY REPORT*  Clinical Data: Shortness of breath.  Pleural effusion.   History of left-sided breast cancers status post left mastectomy, chemotherapy and radiation therapy now complete.  CT CHEST WITHOUT CONTRAST  Technique:  Multidetector CT imaging of the chest was performed following the standard protocol without IV contrast.  Comparison: Chest CT 04/01/2011.  Findings:  Mediastinum: Extensive anterior mediastinal lymphadenopathy is now noted, with the largest lymph nodes measuring up to 1.4-1.7 cm in short axis. Enlarged lymph nodes are also noted the superior mediastinum immediately anterior to the origin of the left common carotid artery measuring up to 1.5 cm in short axis.  Heart size is normal. There is no significant pericardial fluid, thickening or pericardial calcification. There is atherosclerosis of the thoracic aorta, the great vessels of the mediastinum and the coronary arteries, including calcified atherosclerotic plaque in the left main, left anterior descending and right coronary arteries. Esophagus is unremarkable in appearance.  Lungs/Pleura: Although difficult to assess for certain on the noncontrast CT examination, there appears to be multifocal pleural- based nodularity in the left  hemithorax, best demonstrated on image 39 of series 2 where there is an apparent 2.6 x 1.4 cm pleural based nodule in the medial aspect of the left hemithorax.  There is a new moderate left sided pleural effusion which is likely malignant.  Extensive passive atelectasis in the left lower lobe which obscures assessment of the lung parenchyma for underlying pulmonary nodules.  Subpleural reticulation in the anterolateral aspect of the left upper lobe is likely related to a prior left- sided breast radiation therapy.  No definite suspicious appearing pulmonary nodules or masses are noted within the aerated portions of the lungs, although there are a few scattered 1-2 mm pulmonary nodules which are highly nonspecific.  Upper Abdomen: Soft tissue in the upper retroperitoneum immediately  anterolateral to the aorta (image 59 of series 2) measuring 3.0 x 2.1 cm, suspicious for lymphadenopathy.  Additional smaller soft tissue lesions are also noted throughout the visualized upper abdomen.  Extensive edema is noted throughout the left upper abdominal wall anteriorly and laterally.  Musculoskeletal: Status post left modified radical mastectomy and axillary nodal dissection.  There is extensive skin thickening throughout the right breast, and edema throughout the right breast as well as the chest wall bilaterally.  In the right axilla there are numerous enlarged lymph nodes, including the largest node which is 4.7 x 3.8 cm.  Extensive subpectoral lymphadenopathy is also noted.  There are no aggressive appearing lytic or blastic lesions noted in the visualized portions of the skeleton.  IMPRESSION: 1.  Findings, as above, highly concerning for possible inflammatory breast cancer in the right breast, with extensive right axillary, subpectoral, mediastinal and retroperitoneal lymphadenopathy. There is also likely a malignant left pleural effusion. 2. Atherosclerosis, including left main and two-vessel coronary artery disease. 3.  Additional incidental findings, as above.  These results were called by telephone on 02/18/2013 at 03:20 p.m. to Dr. Elvera Lennox, who verbally acknowledged these results.   Original Report Authenticated By: Trudie Reed, M.D.    Portable Chest 1 View  02/18/2013  *RADIOLOGY REPORT*  Clinical Data: Pleural effusion.  PORTABLE CHEST - 1 VIEW  Comparison: 02/17/2013.  Findings: Trachea is midline.  Heart size grossly stable. Bihilar prominence.  Left basilar collapse/consolidation and left pleural effusion appear unchanged. Postoperative changes of left mastectomy.  Surgical clips in the left axilla.  IMPRESSION:  Left lower lobe collapse/consolidation and left pleural effusion with left hilar prominence.  A centrally obstructing mass with postobstructive pneumonitis/pneumonia could  have this appearance. CT chest with contrast would be helpful in further evaluation, as clinically indicated.   Original Report Authenticated By: Leanna Battles, M.D.    Scheduled Meds: . azithromycin  500 mg Oral Daily  . cefTRIAXone (ROCEPHIN) IVPB 1 gram/50 mL D5W  1 g Intravenous Q24H  . heparin  5,000 Units Subcutaneous Q8H  . pantoprazole  40 mg Oral Daily  . predniSONE  40 mg Oral Q breakfast  . simvastatin  20 mg Oral QPM  . sodium chloride  3 mL Intravenous Q12H   Continuous Infusions:   Active Problems:   Breast cancer, right breast   Lymphedema, left arm   Cancer of upper-outer quadrant of female breast   Pleural effusion on left   Collapse of left lung  Time spent: 25  Pamella Pert, MD [facility] Pager [phone]. If 7 PM - 7 AM, please contact night-coverage at [email or web address], password [facility] 02/19/2013, 7:43 AM  LOS: 2 days

## 2013-02-19 NOTE — Evaluation (Signed)
Physical Therapy Evaluation Patient Details Name: [name] MRN: [id number] DOB: 1931-02-05 Today's Date: 02/19/2013 Time: 5784-6962 PT Time Calculation (min): 19 min  PT Assessment / Plan / Recommendation Clinical Impression  Pt presents with hx of R breast cancer s/p resection and now with recurrence and L pleural metastases and L UE lymphadema.  Tolerated OOB to restroom and ambulation in hallway on 2L O2 and with RW at min/mod assist for most mobility.  SaO2 down to 88% on 2L with amb and HR up 147.  RN aware.  Pt will benefit from skilled PT in acute venue to address deficits.  PT recommends HHPT vs ST SNF pending progress in hospital and amount of assist that son can provide.  If pt D/C's home, she may benefit from an Outpatient therapy consult for her LUE lymphedema.     PT Assessment  Patient needs continued PT services    Follow Up Recommendations  SNF;Supervision/Assistance - 24 hour    Does the patient have the potential to tolerate intense rehabilitation      Barriers to Discharge None Pt states that son is with her all the time.     Equipment Recommendations  Rolling walker with 5" wheels    Recommendations for Other Services OT consult   Frequency Min 3X/week    Precautions / Restrictions Precautions Precautions: Fall, LUE lymphedema Precaution Comments: Monitor O2 and HR (HR up to 147 w/ amb) Restrictions Weight Bearing Restrictions: No   Pertinent Vitals/Pain 3/10 LUE      Mobility  Bed Mobility Bed Mobility: Supine to Sit Supine to Sit: 3: Mod assist;HOB elevated Details for Bed Mobility Assistance: Assist for trunk to attain sitting position.  Pt continuously reaching for therapist with max cues for using hands on bed to self assist.  Transfers Transfers: Sit to Stand;Stand to Sit Sit to Stand: 3: Mod assist;With upper extremity assist;From bed;From toilet Stand to Sit: 4: Min assist;With upper extremity assist;To chair/3-in-1;With  armrests Details for Transfer Assistance: Increased assist required from toilet and she was unable to use handrail on wall to pull up from.  Max cues for hand placement and safety when sitting/standing.  Ambulation/Gait Ambulation/Gait Assistance: 4: Min assist Ambulation Distance (Feet): 65 Feet Assistive device: Rolling walker Ambulation/Gait Assistance Details: Cues for sequencing/technique with RW, maintaining upright posture and continued pursed lip breathing.  Took standing rest break after approx 40' with SaO2 at 88% on 2L O2 and HR 147.  RN made aware.  Pt able to recover SaO2 in approx 4 mins to 93% on 2L.  HR down to 113 Gait Pattern: Step-through pattern;Decreased stance time - left;Trunk flexed Gait velocity: decreased Stairs: No Wheelchair Mobility Wheelchair Mobility: No    Exercises     PT Diagnosis: Difficulty walking;Acute pain;Generalized weakness  PT Problem List: Decreased strength;Decreased range of motion;Decreased activity tolerance;Decreased balance;Decreased mobility;Decreased cognition;Decreased knowledge of use of DME;Decreased safety awareness;Decreased skin integrity PT Treatment Interventions: DME instruction;Gait training;Stair training;Functional mobility training;Therapeutic activities;Therapeutic exercise;Neuromuscular re-education;Balance training;Patient/family education   PT Goals Acute Rehab PT Goals PT Goal Formulation: With patient Time For Goal Achievement: 02/26/13 Potential to Achieve Goals: Good Pt will go Supine/Side to Sit: with supervision PT Goal: Supine/Side to Sit - Progress: Goal set today Pt will go Sit to Supine/Side: with supervision PT Goal: Sit to Supine/Side - Progress: Goal set today Pt will go Sit to Stand: with supervision PT Goal: Sit to Stand - Progress: Goal set today Pt will go Stand to Sit: with supervision PT Goal:  Stand to Sit - Progress: Goal set today Pt will Ambulate: 51 - 150 feet;with supervision;with least  restrictive assistive device;Other (comment) (w/ SaO2 > or equal to 90%) PT Goal: Ambulate - Progress: Goal set today Pt will Go Up / Down Stairs: 1-2 stairs;with min assist;with least restrictive assistive device PT Goal: Up/Down Stairs - Progress: Goal set today Pt will Perform Home Exercise Program: with supervision, verbal cues required/provided PT Goal: Perform Home Exercise Program - Progress: Goal set today  Visit Information  Last PT Received On: 02/19/13 Assistance Needed: +1    Subjective Data  Subjective: I haven't tried walking by myself yet.  Patient Stated Goal: to return home.    Prior Functioning  Home Living Lives With: Spouse;Son Available Help at Discharge: Family;Available 24 hours/day Type of Home: House Home Access: Stairs to enter [employer] of Steps: 2 Entrance Stairs-Rails: None Home Layout: One level Bathroom Shower/Tub: Health visitor: Standard Home Adaptive Equipment: None Prior Function Level of Independence: Needs assistance Needs Assistance: Bathing;Dressing Able to Take Stairs?: Yes Driving: No Vocation: Retired Comments: pt states that son was assisting her with ADL's, but she was able to help with all tasks.  Communication Communication: No difficulties    Cognition  Cognition Overall Cognitive Status: No family/caregiver present to determine baseline cognitive functioning Arousal/Alertness: Lethargic Orientation Level: Appears intact for tasks assessed Behavior During Session: Lethargic Cognition - Other Comments: Pt seems to have some memory deficits as she stated that she was not walking at home prior to coming to hospital then said that she was walking to the bathroom when asked how she got to/from restroom at home.     Extremity/Trunk Assessment Left Upper Extremity Assessment LUE ROM/Strength/Tone: Deficits LUE ROM/Strength/Tone Deficits: Noted that pt has LUE lymphedema.  Pt states this started approx  2 weeks ago.  Right Lower Extremity Assessment RLE ROM/Strength/Tone: [facility] for tasks assessed;Deficits RLE ROM/Strength/Tone Deficits: Noted some quad weakness when getting up from lower surface requiring mod assist Left Lower Extremity Assessment LLE ROM/Strength/Tone: [facility] for tasks assessed;Deficits LLE ROM/Strength/Tone Deficits: Noted some quad weakness when getting up from lower surface requiring mod assist   Balance Balance Balance Assessed: Yes Dynamic Standing Balance Dynamic Standing - Balance Support: Left upper extremity supported Dynamic Standing - Level of Assistance: 5: Stand by assistance;4: Min assist Dynamic Standing - Balance Activities: Forward lean/weight shifting Dynamic Standing - Comments: Pt able to stand at RW in restroom to assist with self care at stand at min assist.   End of Session PT - End of Session Equipment Utilized During Treatment: Gait belt;Oxygen Activity Tolerance: Patient limited by fatigue Patient left: in chair;with call bell/phone within reach Nurse Communication: Mobility status  GP     Vista Deck 02/19/2013, 12:00 PM

## 2013-02-19 NOTE — Consult Note (Signed)
Radiation Oncology         [phone] ________________________________  Name: [name] MRN: [id number]  Date: 02/17/2013  DOB: 1931-04-14      DIAGNOSIS: Recurrent breast cancer  HISTORY OF PRESENT ILLNESS::[name] is a 79 y.o. female who is seen as an inpatient. She has a history of left mastectomy for breast cancer in 2010 with subsequent postmastectomy radiotherapy. He was lost to followup but recently has read presented for a recurrence. 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 The patient has been seen by both surgical oncology and medical oncology recently with additional staging studies pending. The patient has had a history of significant lymphedema on the left.  The patient developed some worsening shortness of breath recently. This did not respond adequately to antibiotics and the patient did run out of some of her pulmonary medicines. She has been diagnosed with community acquired pneumonia and also has been found to have a left-sided pleural effusion. The patient's CT scan showed findings highly concerning for both inflammatory breast cancer on the right as well as extensive lymphadenopathy both within the right axilla, sub-pectoral region, mediastinum, and retroperitoneal region. Findings consistent with a malignant left-sided pleural effusion were also present with what appeared to be multifocal pleural based nodularity within the left hemithorax.   PREVIOUS RADIATION THERAPY: Yes as above for left-sided breast cancer. This was given by Dr. Dayton Scrape in our department in 2010   PAST MEDICAL HISTORY:  has a past medical history of Hypertension; Diabetes mellitus without complication; and Breast cancer.     PAST SURGICAL HISTORY: Past Surgical History  Procedure Laterality Date  . Breast surgery       FAMILY HISTORY: family history is not on file.   SOCIAL HISTORY:  reports  that she has been smoking.  She has never used smokeless tobacco. She reports that she does not drink alcohol or use illicit drugs.   ALLERGIES: Review of patient's allergies indicates no known allergies.   MEDICATIONS:  Current Facility-Administered Medications  Medication Dose Route Frequency Provider Last Rate Last Dose  . ipratropium (ATROVENT) nebulizer solution 0.5 mg  0.5 mg Nebulization Q6H PRN Pamella Pert, MD   0.5 mg at 02/19/13 0009   And  . albuterol (PROVENTIL) (5 MG/ML) 0.5% nebulizer solution 2.5 mg  2.5 mg Nebulization Q6H PRN Pamella Pert, MD   2.5 mg at 02/19/13 0008  . azithromycin (ZITHROMAX) tablet 500 mg  500 mg Oral Daily Pamella Pert, MD   500 mg at 02/18/13 0919  . cefTRIAXone (ROCEPHIN) 1 g in dextrose 5 % 50 mL IVPB  1 g Intravenous Q24H Dannielle Huh, RPH   1 g at 02/18/13 0923  . heparin injection 5,000 Units  5,000 Units Subcutaneous Q8H Pamella Pert, MD   5,000 Units at 02/19/13 0555  . HYDROcodone-acetaminophen (NORCO/VICODIN) 5-325 MG per tablet 1 tablet  1 tablet Oral Q4H PRN Leanne Chang, NP   1 tablet at 02/19/13 0301  . pantoprazole (PROTONIX) EC tablet 40 mg  40 mg Oral Daily Pamella Pert, MD   40 mg at 02/18/13 0919  . predniSONE (DELTASONE) tablet 40 mg  40 mg Oral Q breakfast Pamella Pert, MD   40 mg at 02/19/13 0828  . simvastatin (ZOCOR) tablet 20 mg  20 mg Oral QPM Pamella Pert, MD   20 mg at 02/18/13 1606  . sodium chloride 0.9 % injection 3 mL  3 mL Intravenous Q12H Pamella Pert, MD   3 mL at 02/19/13 0831     ROS: as above   PHYSICAL EXAM:  height is 5\' 2"  (1.575 m) and weight is 192 lb 9.6 oz (87.363 kg). Her oral temperature is 98.6 F (37 C). Her blood pressure is 160/60 and her pulse is 113. Her respiration is 18 and oxygen saturation is 95%.   General: Well-developed, in no acute distress HEENT: Normocephalic, atraumatic Cardiovascular: Regular rate and rhythm Respiratory: Decreased breath sounds in the  left base Breasts: Dermal implants present within the right breast, firm left chest status post mastectomy with firmness within the left axilla GI: Soft, nontender, normal bowel sounds Extremities: Significant left upper extremity lymphedema present   LABORATORY DATA:  Lab Results  Component Value Date   WBC 8.6 02/19/2013   HGB 10.6* 02/19/2013   HCT 32.1* 02/19/2013   MCV 85.4 02/19/2013   PLT 452* 02/19/2013   Lab Results  Component Value Date   NA 135 02/19/2013   K 4.4 02/19/2013   CL 99 02/19/2013   CO2 24 02/19/2013   Lab Results  Component Value Date   ALT 7 02/18/2013   AST 16 02/18/2013   ALKPHOS 80 02/18/2013   BILITOT 0.1* 02/18/2013      RADIOGRAPHY: Dg Chest 2 View  02/17/2013  *RADIOLOGY REPORT*  Clinical Data: Cough and shortness of breath.  Hypertension and diabetes.  CHEST - 2 VIEW  Comparison: CT 04/01/2011 and plain film 11/20/2009  Findings: Surgical clips within the left axilla.  Patient rotated left. Cardiomegaly accentuated by AP portable technique.  Small to moderate left-sided pleural effusion. No pneumothorax.  Low lung volumes with resultant pulmonary interstitial prominence.  Left sided airspace disease which is new.  IMPRESSION: Left-sided pleural effusion and adjacent infection or atelectasis. Recommend radiographic follow-up until clearing.  Cardiomegaly with low lung volumes on the frontal.  Interstitial prominence is favored to be secondary.  Concurrent mild pulmonary venous congestion is difficult to exclude.   Original Report Authenticated By: Jeronimo Greaves, M.D.    Ct Chest Wo Contrast  02/18/2013  *RADIOLOGY REPORT*  Clinical Data: Shortness of breath.  Pleural effusion.  History of left-sided breast cancers status post left mastectomy, chemotherapy and radiation therapy now complete.  CT CHEST WITHOUT CONTRAST  Technique:  Multidetector CT imaging of the chest was performed following the standard protocol without IV contrast.  Comparison: Chest CT  04/01/2011.  Findings:  Mediastinum: Extensive anterior mediastinal lymphadenopathy is now noted, with the largest lymph nodes measuring up to 1.4-1.7 cm in short axis. Enlarged lymph nodes are also noted the superior mediastinum immediately anterior to the origin of the left common carotid artery measuring up to 1.5 cm in short axis.  Heart size is normal. There is no significant pericardial fluid, thickening or pericardial calcification. There is atherosclerosis of the thoracic aorta, the great vessels of the mediastinum and the coronary arteries, including calcified atherosclerotic plaque in the left main, left anterior descending and right coronary arteries. Esophagus is unremarkable in appearance.  Lungs/Pleura: Although difficult to assess for certain on the noncontrast CT examination, there appears to be multifocal pleural- based nodularity in the left hemithorax, best demonstrated on image 39 of series 2 where there is an apparent 2.6 x 1.4 cm pleural based nodule in the medial aspect of the left hemithorax.  There is a new moderate left sided pleural effusion which is likely malignant.  Extensive passive atelectasis in the left lower lobe which  obscures assessment of the lung parenchyma for underlying pulmonary nodules.  Subpleural reticulation in the anterolateral aspect of the left upper lobe is likely related to a prior left- sided breast radiation therapy.  No definite suspicious appearing pulmonary nodules or masses are noted within the aerated portions of the lungs, although there are a few scattered 1-2 mm pulmonary nodules which are highly nonspecific.  Upper Abdomen: Soft tissue in the upper retroperitoneum immediately anterolateral to the aorta (image 59 of series 2) measuring 3.0 x 2.1 cm, suspicious for lymphadenopathy.  Additional smaller soft tissue lesions are also noted throughout the visualized upper abdomen.  Extensive edema is noted throughout the left upper abdominal wall anteriorly and  laterally.  Musculoskeletal: Status post left modified radical mastectomy and axillary nodal dissection.  There is extensive skin thickening throughout the right breast, and edema throughout the right breast as well as the chest wall bilaterally.  In the right axilla there are numerous enlarged lymph nodes, including the largest node which is 4.7 x 3.8 cm.  Extensive subpectoral lymphadenopathy is also noted.  There are no aggressive appearing lytic or blastic lesions noted in the visualized portions of the skeleton.  IMPRESSION: 1.  Findings, as above, highly concerning for possible inflammatory breast cancer in the right breast, with extensive right axillary, subpectoral, mediastinal and retroperitoneal lymphadenopathy. There is also likely a malignant left pleural effusion. 2. Atherosclerosis, including left main and two-vessel coronary artery disease. 3.  Additional incidental findings, as above.  These results were called by telephone on 02/18/2013 at 03:20 p.m. to Dr. Elvera Lennox, who verbally acknowledged these results.   Original Report Authenticated By: Trudie Reed, M.D.    Portable Chest 1 View  02/18/2013  *RADIOLOGY REPORT*  Clinical Data: Pleural effusion.  PORTABLE CHEST - 1 VIEW  Comparison: 02/17/2013.  Findings: Trachea is midline.  Heart size grossly stable. Bihilar prominence.  Left basilar collapse/consolidation and left pleural effusion appear unchanged. Postoperative changes of left mastectomy.  Surgical clips in the left axilla.  IMPRESSION:  Left lower lobe collapse/consolidation and left pleural effusion with left hilar prominence.  A centrally obstructing mass with postobstructive pneumonitis/pneumonia could have this appearance. CT chest with contrast would be helpful in further evaluation, as clinically indicated.   Original Report Authenticated By: Leanna Battles, M.D.    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  Results were discussed with the patient by telephone at her request.  She reports no complications from the procedure.  The patient is scheduled to be seen by Dr. Donell Beers on 01/27/2013 at  1:40 p.m.  **END ADDENDUM** SIGNED BY: Cain Saupe, M.D.   01/20/2013  *RADIOLOGY REPORT*  Clinical Data:  History of left mastectomy.  The patient has increasing left upper extremity lymphedema.  Right breast skin and trabecular thickening.  Numerous skin lesions over the right breast and left axilla.  Enlarged right axillary lymph nodes.  The patient presents for biopsy.  ULTRASOUND GUIDED CORE BIOPSY OF THE RIGHT AXILLA  Comparison: Previous exams.  I met with the patient and we discussed the procedure of ultrasound- guided biopsy, including benefits and alternatives.  We discussed the high likelihood of a successful procedure. We discussed the risks of the procedure, including infection, bleeding, tissue injury, clip migration, and inadequate sampling.  Informed written consent was given.  Using sterile technique 2% lidocaine, ultrasound guidance and a 14 gauge automated biopsy device, biopsy was performed of enlarged right axillary lymph node using a lateral approach. Specimens from the same lymph node are submitted in formalin and in saline as needed for flow cytometry.  IMPRESSION: Ultrasound guided biopsy of enlarged right axillary lymph node.  No apparent complications.  Original Report Authenticated By: Norva Pavlov, M.D.        IMPRESSION: The patient unfortunately is presenting with recurrent breast cancer and now has findings suggestive of stage IV disease.  Likely malignant left-sided pleural effusion. The patient indicated to me that her shortness of breath had improved some since coming into the hospital.   PLAN:  I do not see a role for urgent radiotherapy at this time. The patient was  treated by Dr. Dayton Scrape in our department and I will make him aware of the patient's inpatient status on Monday. Suggest consideration of thoracentesis on the left with cytology. Additional staging workup had been planned by Dr. Donell Beers as an outpatient including a PET scan. This could be completed as an inpatient if necessary or as an outpatient if the patient's respiratory status improves with further management. I do not see a need for urgent staging studies currently based on the patient's clinical status.        ________________________________   Radene Gunning, MD, PhD

## 2013-02-20 ENCOUNTER — Inpatient Hospital Stay (HOSPITAL_COMMUNITY): Payer: Medicare Other

## 2013-02-20 DIAGNOSIS — J189 Pneumonia, unspecified organism: Secondary | ICD-10-CM

## 2013-02-20 DIAGNOSIS — R5381 Other malaise: Secondary | ICD-10-CM

## 2013-02-20 LAB — BASIC METABOLIC PANEL
BUN: 31 mg/dL — ABNORMAL HIGH (ref 6–23)
Calcium: 9.7 mg/dL (ref 8.4–10.5)
GFR calc non Af Amer: 44 mL/min — ABNORMAL LOW (ref >90)
Glucose, Bld: 131 mg/dL — ABNORMAL HIGH (ref 70–99)

## 2013-02-20 MED ORDER — LORAZEPAM 0.5 MG PO TABS
0.5000 mg | ORAL_TABLET | Freq: Four times a day (QID) | ORAL | Status: DC | PRN
  Administered 2013-02-20 – 2013-02-23 (×9): 0.5 mg via ORAL
  Filled 2013-02-20 (×9): qty 1

## 2013-02-20 MED ORDER — HYDRALAZINE HCL 20 MG/ML IJ SOLN
10.0000 mg | Freq: Once | INTRAMUSCULAR | Status: AC
  Administered 2013-02-20: 10 mg via INTRAVENOUS
  Filled 2013-02-20: qty 1

## 2013-02-20 MED ORDER — BISACODYL 10 MG RE SUPP: 10.0000 mg | Freq: Every day | RECTAL | Status: DC | PRN

## 2013-02-20 NOTE — Progress Notes (Signed)
[facility] PROGRESS NOTE  GERALDY AKRIDGE OZH:[id number] DOB: 1931/02/18 DOA: 02/17/2013 PCP: [name], MD  Brief Narrative: [name] is a 79 y.o. female has a past medical history significant for breast cancer status post resection in 2010 (left mastectomy status post radiation treatment is well), lost to followup until December of 2013 or ultrasound shows recurrence of her cancer on the right side. 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 She also has a right-sided skin thickening c/w inflammatory changes and also has been having left sided arm lymphedema for the past month. For the last month she's been complaining of worsening shortness or breath and a productive cough. She saw her PCP about a month ago, and he gave her a prescription for amoxicillin for 10 days. She took the prescription without improvement in her symptoms. She has been having more wheezing as well. She is using albuterol about twice a day, and she ran out of his medication last night. When she woke up this morning she felt short of breath (somewhat similar to her previous breathing difficulties) however she had no inhalers and her breathing difficulties haven't improved and she decided to call EMS. She denies any fevers or chills, she denies any chest pain. She has no lightheadedness or dizziness. Has no abdominal pain, nausea or vomiting. She denies any chest pain with deep inspiration. She endorses weight loss, unable to quantify how much.  Assessment/Plan:  Breast cancer - With recurrence on the right. Radiation oncology has been consulted - palliative has been consulted.    Shortness of breath due to Community-acquired pneumonia vs malignant pleural effusion - On Ceftriaxone/Azithromycin  - wheezing improved.  - Duonebs every 6 hours.  - more short of breath this morning, improved after breathing treatment.   Left sided pleural effusion - Most likely malignant effusion due to  known cancer. - Consult to pulmonology 4/11; did not feel like a thoracentesis would be beneficial at this point - probably reason for chest pain this morning.   Cardiomegaly on chest x-ray  - She has no known history of heart disease, 2D echo unremarkable - troponin negative, EKG shows sinus rhythm.   Mild anemia  - likely due to active malignancy   DVT Prophylaxis  - heparin s.q.   Code Status: DNR Family Communication: none  Disposition Plan: Skilled nursing facility early next week  Consultants:  Pulmonology  Procedures:  2D echo Study Conclusions - Left ventricle: The cavity size was normal. Systolic function was normal. The estimated ejection fraction was in the range of 60% to 65%. Wall motion was normal; there were no regional wall motion abnormalities. Mitral valve: Mild regurgitation. Pericardium, extracardiac: A trivial pericardial effusion was identified.  Antibiotics:  Ceftriaxone/Azithromycin 4/10 >>  HPI/Subjective: - breathing difficult this morning and complaining of chest pain.   Objective: Filed Vitals:   02/20/13 0615 02/20/13 0730 02/20/13 0746 02/20/13 1128  BP: 176/71 183/98  136/61  Pulse: 114 134  122  Temp: 98.6 F (37 C) 98.6 F (37 C)    TempSrc: Oral Oral    Resp: 18 28    Height:      Weight: 86.5 kg (190 lb 11.2 oz)     SpO2: 98% 97% 92%     Intake/Output Summary (Last 24 hours) at 02/20/13 1209 Last data filed at 02/20/13 0047  Gross per 24 hour  Intake    480 ml  Output    775 ml  Net   -295 ml  Filed Weights   02/18/13 0551 02/19/13 0611 02/20/13 0615  Weight: 87.59 kg (193 lb 1.6 oz) 87.363 kg (192 lb 9.6 oz) 86.5 kg (190 lb 11.2 oz)    Exam:  General:  NAD  Cardiovascular: regular rate and rhythm, without MRG  Respiratory: good air movement, no wheezing this morning, decreased breath sounds on left lower lung  Abdomen: soft, not tender to palpation, positive bowel sounds  MSK: trace peripheral edema; left  arm swollen, unchanged since last night  Neuro: CN 2-12 grossly intact, MS 5/5 in all 4  Data Reviewed: Basic Metabolic Panel:  Recent Labs Lab 02/17/13 0935 02/18/13 0430 02/19/13 0515 02/20/13 0513  NA 134* 134* 135 135  K 4.6 4.3 4.4 4.8  CL 99 98 99 97  CO2 19 26 24 26   GLUCOSE 137* 213* 119* 131*  BUN 21 30* 33* 31*  CREATININE 1.39* 1.53* 1.48* 1.15*  CALCIUM 8.8 9.3 9.4 9.7   Liver Function Tests:  Recent Labs Lab 02/17/13 0935 02/18/13 0430  AST 22 16  ALT 7 7  ALKPHOS 80 80  BILITOT 0.2* 0.1*  PROT 6.7 6.8  ALBUMIN 2.3* 2.5*   CBC:  Recent Labs Lab 02/17/13 0935 02/18/13 0430 02/19/13 0515  WBC 5.7 4.0 8.6  NEUTROABS 3.5  --   --   HGB 10.9* 10.3* 10.6*  HCT 32.8* 31.7* 32.1*  MCV 87.5 86.1 85.4  PLT 391 473* 452*   Cardiac Enzymes:  Recent Labs Lab 02/17/13 0935 02/20/13 0801  TROPONINI <0.30 <0.30   BNP (last 3 results)  Recent Labs  02/17/13 0935  PROBNP 4967.0*   Studies: Ct Chest Wo Contrast  02/18/2013  *RADIOLOGY REPORT*  Clinical Data: Shortness of breath.  Pleural effusion.  History of left-sided breast cancers status post left mastectomy, chemotherapy and radiation therapy now complete.  CT CHEST WITHOUT CONTRAST  Technique:  Multidetector CT imaging of the chest was performed following the standard protocol without IV contrast.  Comparison: Chest CT 04/01/2011.  Findings:  Mediastinum: Extensive anterior mediastinal lymphadenopathy is now noted, with the largest lymph nodes measuring up to 1.4-1.7 cm in short axis. Enlarged lymph nodes are also noted the superior mediastinum immediately anterior to the origin of the left common carotid artery measuring up to 1.5 cm in short axis.  Heart size is normal. There is no significant pericardial fluid, thickening or pericardial calcification. There is atherosclerosis of the thoracic aorta, the great vessels of the mediastinum and the coronary arteries, including calcified atherosclerotic  plaque in the left main, left anterior descending and right coronary arteries. Esophagus is unremarkable in appearance.  Lungs/Pleura: Although difficult to assess for certain on the noncontrast CT examination, there appears to be multifocal pleural- based nodularity in the left hemithorax, best demonstrated on image 39 of series 2 where there is an apparent 2.6 x 1.4 cm pleural based nodule in the medial aspect of the left hemithorax.  There is a new moderate left sided pleural effusion which is likely malignant.  Extensive passive atelectasis in the left lower lobe which obscures assessment of the lung parenchyma for underlying pulmonary nodules.  Subpleural reticulation in the anterolateral aspect of the left upper lobe is likely related to a prior left- sided breast radiation therapy.  No definite suspicious appearing pulmonary nodules or masses are noted within the aerated portions of the lungs, although there are a few scattered 1-2 mm pulmonary nodules which are highly nonspecific.  Upper Abdomen: Soft tissue in the upper retroperitoneum immediately anterolateral  to the aorta (image 59 of series 2) measuring 3.0 x 2.1 cm, suspicious for lymphadenopathy.  Additional smaller soft tissue lesions are also noted throughout the visualized upper abdomen.  Extensive edema is noted throughout the left upper abdominal wall anteriorly and laterally.  Musculoskeletal: Status post left modified radical mastectomy and axillary nodal dissection.  There is extensive skin thickening throughout the right breast, and edema throughout the right breast as well as the chest wall bilaterally.  In the right axilla there are numerous enlarged lymph nodes, including the largest node which is 4.7 x 3.8 cm.  Extensive subpectoral lymphadenopathy is also noted.  There are no aggressive appearing lytic or blastic lesions noted in the visualized portions of the skeleton.  IMPRESSION: 1.  Findings, as above, highly concerning for possible  inflammatory breast cancer in the right breast, with extensive right axillary, subpectoral, mediastinal and retroperitoneal lymphadenopathy. There is also likely a malignant left pleural effusion. 2. Atherosclerosis, including left main and two-vessel coronary artery disease. 3.  Additional incidental findings, as above.  These results were called by telephone on 02/18/2013 at 03:20 p.m. to Dr. Elvera Lennox, who verbally acknowledged these results.   Original Report Authenticated By: Trudie Reed, M.D.    Dg Chest Port 1 View  02/20/2013  *RADIOLOGY REPORT*  Clinical Data: Follow up left lower lobe atelectasis and/or pneumonia and left pleural effusion.  PORTABLE CHEST - 1 VIEW 02/20/2013 0819 hours:  Comparison: Portable chest x-ray and CT chest yesterday.  Two-view chest x-ray 02/17/2013.  Findings: Persistent dense consolidation in the left lower lobe and moderate-sized left pleural effusion, unchanged.  Cardiac silhouette normal in size, unchanged.  Mild pulmonary venous hypertension without overt edema.  Prior left axillary node dissection.  IMPRESSION: Stable dense left lower lobe atelectasis or pneumonia and moderate- sized left pleural effusion.  Developing pulmonary venous hypertension without overt edema, query incipient fluid overload.   Original Report Authenticated By: Hulan Saas, M.D.    Scheduled Meds: . azithromycin  500 mg Oral Daily  . cefTRIAXone (ROCEPHIN) IVPB 1 gram/50 mL D5W  1 g Intravenous Q24H  . heparin  5,000 Units Subcutaneous Q8H  . pantoprazole  40 mg Oral Daily  . predniSONE  40 mg Oral Q breakfast  . simvastatin  20 mg Oral QPM  . sodium chloride  3 mL Intravenous Q12H   Continuous Infusions:   Active Problems:   Breast cancer, right breast   Lymphedema, left arm   Cancer of upper-outer quadrant of female breast   Pleural effusion on left   Collapse of left lung  Time spent: 15  Pamella Pert, MD [facility] Pager [phone]. If 7 PM - 7 AM, please  contact night-coverage at [email or web address], password [facility] 02/20/2013, 12:09 PM  LOS: 3 days

## 2013-02-20 NOTE — Evaluation (Signed)
Occupational Therapy Evaluation Patient Details Name: [name] MRN: [id number] DOB: 1931-03-01 Today's Date: 02/20/2013 Time: 4098-1191 OT Time Calculation (min): 23 min  OT Assessment / Plan / Recommendation Clinical Impression  This 79 year old female was admitted with R breast CA.  She has new LUE lymphedema (x 1 month) and L pleural effusion.  Pt is appropriate for skilled OT to increase safety and independence with adls and bathroom transfers.      OT Assessment  Patient needs continued OT Services    Follow Up Recommendations  SNF (vs HHOT with 24/7.  ? OP lymphedema program when ready)    Barriers to Discharge      Equipment Recommendations  3 in 1 bedside comode (? tub bench)    Recommendations for Other Services    Frequency  Min 2X/week    Precautions / Restrictions Precautions Precautions: Fall Precaution Comments: Monitor O2 and HR (HR up to 147 w/ amb) Restrictions Weight Bearing Restrictions: No   Pertinent Vitals/Pain No c/o pain.  BP 136/61.  HR 105-122    ADL  Grooming: Set up Where Assessed - Grooming: Unsupported sitting Upper Body Bathing: Set up Where Assessed - Upper Body Bathing: Unsupported sitting Lower Body Bathing: Moderate assistance Where Assessed - Lower Body Bathing: Supported sit to stand Upper Body Dressing: Minimal assistance Where Assessed - Upper Body Dressing: Unsupported sitting Lower Body Dressing: Maximal assistance Where Assessed - Lower Body Dressing: Supported sit to stand Toilet Transfer: Minimal assistance Toilet Transfer Method: Surveyor, minerals: Bedside commode (elevated bed and elevated 3:1) Toileting - Clothing Manipulation and Hygiene: Minimal assistance Where Assessed - Engineer, mining and Hygiene: Sit to stand from 3-in-1 or toilet Equipment Used:  (hand held assist) Transfers/Ambulation Related to ADLs: SPT to 3:1 then chair ADL Comments: Pt has had lymphedema x 1  month--since recurrence of breast CA.  D/C plan undetermined.   Pt has been having some assistance with ADLs lately.  She has not been getting into tub.  Pt's HR stayed between 105-122 during OT session.  May introduce AE if pt is interested    OT Diagnosis: Generalized weakness  OT Problem List: Decreased strength;Decreased activity tolerance;Impaired balance (sitting and/or standing);Decreased knowledge of use of DME or AE;Cardiopulmonary status limiting activity OT Treatment Interventions: Self-care/ADL training;DME and/or AE instruction;Therapeutic activities;Patient/family education;Balance training   OT Goals Acute Rehab OT Goals OT Goal Formulation: With patient Time For Goal Achievement: 03/06/13 Potential to Achieve Goals: Good ADL Goals Pt Will Perform Lower Body Bathing: with min assist;Sit to stand from chair;with adaptive equipment ADL Goal: Lower Body Bathing - Progress: Goal set today Pt Will Perform Lower Body Dressing: with min assist;Sit to stand from chair;with adaptive equipment ADL Goal: Lower Body Dressing - Progress: Goal set today Pt Will Transfer to Toilet: with min assist;Ambulation;3-in-1 (min guard, all aspects) ADL Goal: Toilet Transfer - Progress: Goal set today Miscellaneous OT Goals Miscellaneous OT Goal #1: pt will verbalize vs. demonstrate use of tub bench with min guard A OT Goal: Miscellaneous Goal #1 - Progress: Goal set today Miscellaneous OT Goal #2: Pt will incorporate at least one rest break per session for energy conservation OT Goal: Miscellaneous Goal #2 - Progress: Goal set today  Visit Information  Last OT Received On: 02/20/13 Assistance Needed: +1    Subjective Data  Subjective: I think I could make water Patient Stated Goal: none stated.  Agreeable to OT   Prior Functioning     Home Living  Lives With: Son (grandson) Bathroom Shower/Tub: Health visitor: Engineer, mining: None Prior Function Level  of Independence: Needs assistance Needs Assistance: Bathing;Dressing Driving: No Vocation: Retired Comments: walks to bathroom at night:  no dme Communication Communication: No difficulties Dominant Hand: Right         Vision/Perception     Copywriter, advertising Overall Cognitive Status: Appears within functional limits for tasks assessed/performed Arousal/Alertness: Awake/alert Orientation Level: Appears intact for tasks assessed Behavior During Session: [facility] for tasks performed Cognition - Other Comments: family members at home different from PT's eval:  unable to verify    Extremity/Trunk Assessment Right Upper Extremity Assessment RUE ROM/Strength/Tone: Within functional levels Left Upper Extremity Assessment LUE ROM/Strength/Tone: Deficits LUE ROM/Strength/Tone Deficits: LUE lymphedema x 1 month.  Able to lift arm to about 80. Distal ROM WFLs     Mobility Bed Mobility Supine to Sit: HOB elevated;With rails;3: Mod assist Sitting - Scoot to Edge of Bed: 3: Mod assist;With rail Transfers Sit to Stand: 4: Min assist;With upper extremity assist;From chair/3-in-1;From bed;From elevated surface Details for Transfer Assistance: assist to raise from surface     Exercise     Balance     End of Session OT - End of Session Activity Tolerance: Patient tolerated treatment well Patient left: in chair;with call bell/phone within reach;with chair alarm set  GO     Bryne Lindon 02/20/2013, 12:19 PM Marica Otter, OTR/L [phone] 02/20/2013

## 2013-02-20 NOTE — Consult Note (Signed)
Patient [name]      DOB: 19-Jun-1931      YNW:[id number]     Consult Note from the Palliative Medicine Team at [facility]    Consult Requested by: Dr Elvera Lennox     PCP: [name], MD Reason for Consultation: Clarification of GOC and options     Phone [phone]  Assessment of patients Current state: &[age] yo female with PMH significant for breat cancer, s/p resection 2010 followed by Dr Welton Flakes, Dr Donell Beers, Dr Mitzi Hansen.  Now with recurrence, spoke with Dr Welton Flakes several weeks ago regarding possible oral chemo agents.  Admitted 02-17-13 with increased SOB, continued functional decline.  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   Decisions related to  medical interventions and anticipatory care needs now being considered.  Past psych history noted, on monthly Invega   Consult is for review of medical treatment options, clarification of goals of care and end of life issues, disposition and options, and symptom recommendation.  This NP Lorinda Creed reviewed medical records, received report from team, assessed the patient and then meet at the patient's bedside along with her son [name]  to discuss diagnosis prognosis, GOC, EOL wishes disposition and options.   A detailed discussion was had today regarding advanced directives.  Concepts specific to code status, artifical feeding and hydration, continued IV antibiotics and rehospitalization was had.  The difference between a aggressive medical intervention path  and a palliative comfort care path for this patient at this time was had.  Values and goals of care important to patient and family were attempted to be elicited.  Concept of Hospice and Palliative Care were discussed  Natural trajectory and expectations at EOL were discussed.  Questions and concerns addressed.  Hard Choices booklet left for review. Family encouraged to call with questions or concerns.  PMT will continue to support  holistically.      Goals of Care: 1.  Code Status: DNR/DNI   2. Scope of Treatment: 1. Vital Signs: per unit  2. Respiratory/Oxygen:as needed for treatment 3. Nutritional Support/Tube Feeds:no artificial feeding now or in the future 4. Antibiotics: yes 5. Blood Products:yes 6. IVF:yes 7. Review of Medications to be discontinued:continue present medications as needed for comfort 8. Labs: yes 9. Telemetry:none 10. Consults:    Patient and family need to speak to oncology before they can make any decisions regarding GOC.  They highly respect Dr Welton Flakes opinion and are hopeful for her input before making any decsions   4. Disposition: Dependant on ooutcomes   3. Symptom Management:   1. Anxiety/Agitation: Ativan 0.5 mg every 6 hr prn (ordered by attending previously) 2. Pain: Vicodin 5-325 mg every 4 hrs prn (ordered by attending previously) 3. Bowel Regimen:dulcolax supp 4. Weakness/Fatigue: PT consult  4. Psychosocial: Emotional support offered to patient and family at bedside.  This is a very difficult situation.  The main care giver, her son , has medical issues of his own (on dialysis), he gets frustrated with what he perceives as his mother's lack of motivation.  Discussed in detail weakness and fatigue as it relates to the disease process.  PMT will continue to support holistically   They both want HPOA secured, consulted SW   Brief HPI:&[age] yo female with PMH significant for breat cancer, s/p resection 2010 followed by Dr Welton Flakes, Dr Donell Beers, Dr Mitzi Hansen.  Now with recurrence, spoke with Dr Welton Flakes several weeks ago regarding possible oral chemo agents.  Admitted 02-17-13 with increased SOB, continued functional decline.  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   Decisions related to  medical interventions and anticipatory care needs now being considered.  Past psych history noted, on monthly Intuiv     ROS: weakness, SOB,  H/O "anger issues" (seen at  [facility])    PMH:  Past Medical History  Diagnosis Date  . Hypertension   . Diabetes mellitus without complication   . Breast cancer      PSH: Past Surgical History  Procedure Laterality Date  . Breast surgery     I have reviewed the FH and SH and  If appropriate update it with new information. No Known Allergies Scheduled Meds: . azithromycin  500 mg Oral Daily  . cefTRIAXone (ROCEPHIN) IVPB 1 gram/50 mL D5W  1 g Intravenous Q24H  . heparin  5,000 Units Subcutaneous Q8H  . pantoprazole  40 mg Oral Daily  . predniSONE  40 mg Oral Q breakfast  . simvastatin  20 mg Oral QPM  . sodium chloride  3 mL Intravenous Q12H   Continuous Infusions:  PRN Meds:.albuterol, HYDROcodone-acetaminophen, ipratropium, LORazepam    BP 136/61  Pulse 122  Temp(Src) 98.6 F (37 C) (Oral)  Resp 28  Ht 5\' 2"  (1.575 m)  Wt 86.5 kg (190 lb 11.2 oz)  BMI 34.87 kg/m2  SpO2 92%   PPS:30 %   Intake/Output Summary (Last 24 hours) at 02/20/13 1502 Last data filed at 02/20/13 1220  Gross per 24 hour  Intake    720 ml  Output    775 ml  Net    -55 ml    Physical Exam:  General: chronically ill appearing, NAD HEENT:  MM, no exudate Chest:   tachypneic diminished in bases  L>R  Skin: noted blistered rash over chest wall Rside >L and left upper arm and at resection site, non painful, non pruritic   CVS: RRR Abdomen: obese soft NT +BS Ext:  Left arm with lymphedema  Neuro:alert and oriented X3 Psych: engaged, cooperative, with insight to present situation  Labs: CBC    Component Value Date/Time   WBC 8.6 02/19/2013 0515   WBC 6.2 02/10/2013 1502   RBC 3.76* 02/19/2013 0515   RBC 3.84 02/10/2013 1502   HGB 10.6* 02/19/2013 0515   HGB 11.1* 02/10/2013 1502   HCT 32.1* 02/19/2013 0515   HCT 34.4* 02/10/2013 1502   PLT 452* 02/19/2013 0515   PLT 429* 02/10/2013 1502   MCV 85.4 02/19/2013 0515   MCV 89.5 02/10/2013 1502   MCH 28.2 02/19/2013 0515   MCH 28.9 02/10/2013 1502   MCHC 33.0 02/19/2013 0515    MCHC 32.3 02/10/2013 1502   RDW 15.3 02/19/2013 0515   RDW 15.5* 02/10/2013 1502   LYMPHSABS 1.1 02/17/2013 0935   LYMPHSABS 0.7* 02/10/2013 1502   MONOABS 0.7 02/17/2013 0935   MONOABS 0.4 02/10/2013 1502   EOSABS 0.4 02/17/2013 0935   EOSABS 0.2 02/10/2013 1502   BASOSABS 0.0 02/17/2013 0935   BASOSABS 0.1 02/10/2013 1502    BMET    Component Value Date/Time   NA 135 02/20/2013 0513   NA 137 02/10/2013 1502   K 4.8 02/20/2013 0513   K 4.7 02/10/2013 1502   CL 97 02/20/2013 0513   CL 105 02/10/2013 1502   CO2 26 02/20/2013 0513   CO2 20* 02/10/2013 1502   GLUCOSE 131* 02/20/2013 0513   GLUCOSE 103* 02/10/2013 1502   BUN 31* 02/20/2013 0513   BUN 23.5 02/10/2013 1502   CREATININE 1.15* 02/20/2013 1610  CREATININE 1.7* 02/10/2013 1502   CREATININE 1.41* 01/27/2013 1517   CALCIUM 9.7 02/20/2013 0513   CALCIUM 9.4 02/10/2013 1502   GFRNONAA 44* 02/20/2013 0513   GFRAA 51* 02/20/2013 0513    CMP     Component Value Date/Time   NA 135 02/20/2013 0513   NA 137 02/10/2013 1502   K 4.8 02/20/2013 0513   K 4.7 02/10/2013 1502   CL 97 02/20/2013 0513   CL 105 02/10/2013 1502   CO2 26 02/20/2013 0513   CO2 20* 02/10/2013 1502   GLUCOSE 131* 02/20/2013 0513   GLUCOSE 103* 02/10/2013 1502   BUN 31* 02/20/2013 0513   BUN 23.5 02/10/2013 1502   CREATININE 1.15* 02/20/2013 0513   CREATININE 1.7* 02/10/2013 1502   CREATININE 1.41* 01/27/2013 1517   CALCIUM 9.7 02/20/2013 0513   CALCIUM 9.4 02/10/2013 1502   PROT 6.8 02/18/2013 0430   PROT 7.4 02/10/2013 1502   ALBUMIN 2.5* 02/18/2013 0430   ALBUMIN 2.3* 02/10/2013 1502   AST 16 02/18/2013 0430   AST 18 02/10/2013 1502   ALT 7 02/18/2013 0430   ALT 8 02/10/2013 1502   ALKPHOS 80 02/18/2013 0430   ALKPHOS 96 02/10/2013 1502   BILITOT 0.1* 02/18/2013 0430   BILITOT 0.25 02/10/2013 1502   GFRNONAA 44* 02/20/2013 0513   GFRAA 51* 02/20/2013 0513    Chest Xray Reviewed/Impressions:  Stable dense left lower lobe atelectasis or pneumonia and moderate-  sized left pleural effusion. Developing  pulmonary venous  hypertension without overt edema, query incipient fluid overload.     Time In Time Out Total Time Spent with Patient Total Overall Time  1400 1530 75 min 90 min    Greater than 50%  of this time was spent counseling and coordinating care related to the above assessment and plan.  Lorinda Creed NP  Palliative Medicine Team Team Phone # [phone] Pager [phone]  Discussed above with Dr Randol Kern  PMT will continue to support holistically

## 2013-02-20 NOTE — Progress Notes (Signed)
Brief Pharmacy Note: Ceftriaxone  Day 4 Ceftriaxone for treatment of CAP with Azithromycin po. Ceftriaxone is not renally excreted, no adjustment necessary. Pharmacy will sign off.  Thank you, Otho Bellows PharmD Pager [phone] 02/20/2013, 11:31 AM

## 2013-02-20 NOTE — Progress Notes (Signed)
Pt short of breath, labored, using accessory muscles and complains of chest pain.  BP 183/98, HR 134, Resp 28 and oxygen saturations 97% on 4 L/min.  MD notified.  Will continue to monitor.

## 2013-02-21 DIAGNOSIS — R531 Weakness: Secondary | ICD-10-CM

## 2013-02-21 DIAGNOSIS — R0600 Dyspnea, unspecified: Secondary | ICD-10-CM

## 2013-02-21 DIAGNOSIS — J90 Pleural effusion, not elsewhere classified: Secondary | ICD-10-CM

## 2013-02-21 HISTORY — DX: Weakness: R53.1

## 2013-02-21 LAB — BASIC METABOLIC PANEL
BUN: 32 mg/dL — ABNORMAL HIGH (ref 6–23)
CO2: 27 mEq/L (ref 19–32)
Chloride: 97 mEq/L (ref 96–112)
Creatinine, Ser: 1.19 mg/dL — ABNORMAL HIGH (ref 0.50–1.10)

## 2013-02-21 NOTE — Progress Notes (Signed)
[facility] PROGRESS NOTE  [name]:[id number] DOB: 15-Aug-1931 DOA: 02/17/2013 PCP: [name], MD  Brief Narrative: [name] is a 79 y.o. female has a past medical history significant for breast cancer status post resection in 2010 (left mastectomy status post radiation treatment is well), lost to followup until December of 2013 or ultrasound shows recurrence of her cancer on the right side. 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 She also has a right-sided skin thickening c/w inflammatory changes and also has been having left sided arm lymphedema for the past month. For the last month she's been complaining of worsening shortness or breath and a productive cough. She saw her PCP about a month ago, and he gave her a prescription for amoxicillin for 10 days. She took the prescription without improvement in her symptoms. She has been having more wheezing as well. She is using albuterol about twice a day, and she ran out of his medication last night. When she woke up this morning she felt short of breath (somewhat similar to her previous breathing difficulties) however she had no inhalers and her breathing difficulties haven't improved and she decided to call EMS. She denies any fevers or chills, she denies any chest pain. She has no lightheadedness or dizziness. Has no abdominal pain, nausea or vomiting. She denies any chest pain with deep inspiration. She endorses weight loss, unable to quantify how much.  Assessment/Plan:  Breast cancer - With recurrence on the right. Radiation oncology has been consulted - palliative has been consulted.    Shortness of breath due to Community-acquired pneumonia vs malignant pleural effusion - On Ceftriaxone/Azithromycin  - wheezing improved.  - Duonebs every 6 hours.   Left sided pleural effusion - Most likely malignant effusion due to known cancer. - Consult to pulmonology 4/11; did not feel like a  thoracentesis would be beneficial at this point - probably reason for chest pain this morning.   Cardiomegaly on chest x-ray  - She has no known history of heart disease, 2D echo unremarkable - troponin negative, EKG shows sinus rhythm.   Mild anemia  - likely due to active malignancy   DVT Prophylaxis  - heparin s.q.   Code Status: DNR Family Communication: none  Disposition Plan: Skilled nursing facility  Consultants:  Pulmonology  Rad Onc  Procedures:  2D echo Study Conclusions - Left ventricle: The cavity size was normal. Systolic function was normal. The estimated ejection fraction was in the range of 60% to 65%. Wall motion was normal; there were no regional wall motion abnormalities. Mitral valve: Mild regurgitation. Pericardium, extracardiac: A trivial pericardial effusion was identified.  Antibiotics:  Ceftriaxone/Azithromycin 4/10 >>  HPI/Subjective: - feeling well  Objective: Filed Vitals:   02/20/13 2150 02/21/13 0500 02/21/13 0610 02/21/13 1341  BP: 151/63  150/55 126/48  Pulse: 110  112 65  Temp: 99.4 F (37.4 C)  98.4 F (36.9 C) 98.9 F (37.2 C)  TempSrc: Oral  Oral Oral  Resp: 20  16 16   Height:      Weight:  86.9 kg (191 lb 9.3 oz) 86.9 kg (191 lb 9.3 oz)   SpO2: 100%  100% 99%    Intake/Output Summary (Last 24 hours) at 02/21/13 1555 Last data filed at 02/21/13 1214  Gross per 24 hour  Intake    240 ml  Output    850 ml  Net   -610 ml   Filed Weights   02/20/13 0615 02/21/13 0500 02/21/13 8119  Weight: 86.5 kg (190 lb 11.2 oz) 86.9 kg (191 lb 9.3 oz) 86.9 kg (191 lb 9.3 oz)    Exam:  General:  NAD  Cardiovascular: regular rate and rhythm, without MRG  Respiratory: good air movement, no wheezing this morning, decreased breath sounds on left lower lung  Abdomen: soft, not tender to palpation, positive bowel sounds  MSK: trace peripheral edema; left arm swollen, unchanged since last night  Neuro: CN 2-12 grossly intact, MS  5/5 in all 4  Data Reviewed: Basic Metabolic Panel:  Recent Labs Lab 02/17/13 0935 02/18/13 0430 02/19/13 0515 02/20/13 0513 02/21/13 0436  NA 134* 134* 135 135 134*  K 4.6 4.3 4.4 4.8 4.6  CL 99 98 99 97 97  CO2 19 26 24 26 27   GLUCOSE 137* 213* 119* 131* 141*  BUN 21 30* 33* 31* 32*  CREATININE 1.39* 1.53* 1.48* 1.15* 1.19*  CALCIUM 8.8 9.3 9.4 9.7 9.3   Liver Function Tests:  Recent Labs Lab 02/17/13 0935 02/18/13 0430  AST 22 16  ALT 7 7  ALKPHOS 80 80  BILITOT 0.2* 0.1*  PROT 6.7 6.8  ALBUMIN 2.3* 2.5*   CBC:  Recent Labs Lab 02/17/13 0935 02/18/13 0430 02/19/13 0515  WBC 5.7 4.0 8.6  NEUTROABS 3.5  --   --   HGB 10.9* 10.3* 10.6*  HCT 32.8* 31.7* 32.1*  MCV 87.5 86.1 85.4  PLT 391 473* 452*   Cardiac Enzymes:  Recent Labs Lab 02/17/13 0935 02/20/13 0801 02/20/13 1328 02/20/13 1932  TROPONINI <0.30 <0.30 <0.30 <0.30   BNP (last 3 results)  Recent Labs  02/17/13 0935  PROBNP 4967.0*   Studies: Dg Chest Port 1 View  02/20/2013  *RADIOLOGY REPORT*  Clinical Data: Follow up left lower lobe atelectasis and/or pneumonia and left pleural effusion.  PORTABLE CHEST - 1 VIEW 02/20/2013 0819 hours:  Comparison: Portable chest x-ray and CT chest yesterday.  Two-view chest x-ray 02/17/2013.  Findings: Persistent dense consolidation in the left lower lobe and moderate-sized left pleural effusion, unchanged.  Cardiac silhouette normal in size, unchanged.  Mild pulmonary venous hypertension without overt edema.  Prior left axillary node dissection.  IMPRESSION: Stable dense left lower lobe atelectasis or pneumonia and moderate- sized left pleural effusion.  Developing pulmonary venous hypertension without overt edema, query incipient fluid overload.   Original Report Authenticated By: Hulan Saas, M.D.    Scheduled Meds: . azithromycin  500 mg Oral Daily  . cefTRIAXone (ROCEPHIN) IVPB 1 gram/50 mL D5W  1 g Intravenous Q24H  . heparin  5,000 Units  Subcutaneous Q8H  . pantoprazole  40 mg Oral Daily  . predniSONE  40 mg Oral Q breakfast  . simvastatin  20 mg Oral QPM  . sodium chloride  3 mL Intravenous Q12H   Continuous Infusions:   Active Problems:   Breast cancer, right breast   Lymphedema, left arm   Cancer of upper-outer quadrant of female breast   Pleural effusion on left   Collapse of left lung   Weakness generalized   Dyspnea  Time spent: 15  Pamella Pert, MD [facility] Pager [phone]. If 7 PM - 7 AM, please contact night-coverage at [email or web address], password [facility] 02/21/2013, 3:55 PM  LOS: 4 days

## 2013-02-21 NOTE — Progress Notes (Signed)
CSW provided patient with Advance Directives packet. The patient wishes to designate her son, [name] as her primary healthcare agent. Patient requested to review packet with her son before signing. CSW gave patient business card & instructed to call when she's ready to sign & have it notarized. CSW also spoke with patient re: discharge plans, she states that she wishes to return home with her son at discharge rather than SNF. CSW will follow-up.  Unice Bailey, LCSW [facility] Clinical Social Worker cell #: [phone]

## 2013-02-21 NOTE — Progress Notes (Signed)
   CARE MANAGEMENT NOTE 02/21/2013  Patient:  [name], [name]   Account Number:  [id number]  Date Initiated:  02/21/2013  Documentation initiated by:  Jiles Crocker  Subjective/Objective Assessment:   ADMITTED WITH PNEUMONIA     Action/Plan:   PCP: [name], MD;  LIVES AT HOME WITH SPOUSE/ POSSIBLY NEED HHC AT DISCHARGE- DISEASE MANAGEMENT PROGRAM, CM FOLLOWING FOR DCP VS SHORT TERM SNF;   Anticipated DC Date:  02/24/2013   Anticipated DC Plan:  HOME W HOME HEALTH SERVICES      DC Planning Services  CM consult         Status of service:  In process, will continue to follow Medicare Important Message given?  NA - LOS <3 / Initial given by admissions (If response is "NO", the following Medicare IM given date fields will be blank) Per UR Regulation:  Reviewed for med. necessity/level of care/duration of stay Comments:  02/21/2013 - B Haneef Hallquist RN,BSN,MHA

## 2013-02-21 NOTE — Progress Notes (Signed)
Palliative Medicine Team SW Pt discussed in PMT rounds, referred for psychosocial support. Met with pt alone at bedside to introduce myself and role for psychosocial support. Pt reports high spirits, relief that she is "feeling much better". Pt does seem to be somewhat confused at times about time; also at one point in conversation reported that she lives with her (deceased, per son) parents.   Pt reports plan to hear from physicians involved in her care before making any definitive decisions for goals of care. Pt reports attempt to balance "doing a little more, since I feel good" with "not wanting to get ahead of myself and not be strong enough". Pt reports plan to regroup with PMT NP Lorinda Creed after "seeing how today goes". Pt also has received advance directives packet. Pt reports need to "think about what could happen, just in case". Pt reports desire to wait until son present to complete documents.  Pt reports having two son, but report support comes primarily from [place] and her husband, who she reports were HS sweethearts. Pt reports being [facility], has Occupational hygienist support, made aware of chaplain availability.   Pt also reports having been a pt of [facility] for "several years" for "anger management issues". Pt reports her satisfaction with their care--she receives a "monthly injection", but unable to recall the name or its prescribed indication.   Spoke with son [name] by phone who affirms his perception that pt "talking out of it" on his previous conversation with pt. Provided emotional support to son as he has his own health issues, had H/D today. Son reports his feeling that pt often "puts on" and has a hx of confabulation/manipulative behavior. Son expressed his wonder if pt's mental status is 2/2 some medical condition. Son confirmed pt's stated plan to hear more from oncology and to remeet with PMT tomorrow. Available as needed.  Kennieth Francois,  LCSWA PMT PHONE [phone]  Pager [phone]

## 2013-02-21 NOTE — Progress Notes (Addendum)
Physical Therapy Treatment Patient Details Name: [name] MRN: [id number] DOB: 17-Apr-1931 Today's Date: 02/21/2013 Time: 4098-1191 PT Time Calculation (min): 23 min  PT Assessment / Plan / Recommendation Comments on Treatment Session  Pt progressing well from last PT visit.  Note that she is on increased O2, however sats remained in 90's and she did not get as SOB with ambulation today.  HR continues to be elevated with ambulation and was up to 137 today.     Follow Up Recommendations  Supervision/Assistance - 24 hour;Home health PT (continue to feel that she will benefit from OP lymphedema therapy at D/C)     Does the patient have the potential to tolerate intense rehabilitation     Barriers to Discharge        Equipment Recommendations  None recommended by PT    Recommendations for Other Services    Frequency Min 3X/week   Plan Discharge plan needs to be updated    Precautions / Restrictions Precautions Precautions: Fall Precaution Comments: Monitor O2 and HR (HR up to 147 w/ amb), LUE lymphedema Restrictions Weight Bearing Restrictions: No   Pertinent Vitals/Pain Pt states there is still some pain in LUE, however improved from yesterday    Mobility  Bed Mobility Bed Mobility: Not assessed Transfers Transfers: Sit to Stand;Stand to Sit Sit to Stand: 5: Supervision;With armrests;From chair/3-in-1 Stand to Sit: 4: Min guard;With upper extremity assist;With armrests;To chair/3-in-1 Details for Transfer Assistance: Pt does well using UEs to stand, however requires cues for keeping RW with her until at seating surface and using hands to reach back when sitting.  Ambulation/Gait Ambulation/Gait Assistance: 4: Min guard Ambulation Distance (Feet): 100 Feet Assistive device: Rolling walker Ambulation/Gait Assistance Details: Pt demos increased steadiness today with ambulation vs inital eval.  Min cues for maintaining position inside of RW and for pursed lip breathing.   Ambulated on 4L O2 with SaO2 in mid to upper 90's throughout with HR up to 137 at highest today.  RN notified.  Gait Pattern: Step-through pattern;Decreased stance time - left;Trunk flexed Gait velocity: decreased    Exercises     PT Diagnosis:    PT Problem List:   PT Treatment Interventions:     PT Goals Acute Rehab PT Goals PT Goal Formulation: With patient Time For Goal Achievement: 02/26/13 Potential to Achieve Goals: Good Pt will go Sit to Stand: with modified independence PT Goal: Sit to Stand - Progress: Updated due to goal met Pt will go Stand to Sit: with supervision PT Goal: Stand to Sit - Progress: Progressing toward goal Pt will Ambulate: 51 - 150 feet;with supervision;with least restrictive assistive device;Other (comment) PT Goal: Ambulate - Progress: Progressing toward goal  Visit Information  Last PT Received On: 02/21/13 Assistance Needed: +1    Subjective Data  Subjective: Am I going to walk in the w/c? (kidding) Patient Stated Goal: to return home.    Cognition  Cognition Overall Cognitive Status: Appears within functional limits for tasks assessed/performed Arousal/Alertness: Awake/alert Orientation Level: Appears intact for tasks assessed Behavior During Session: [facility] for tasks performed Cognition - Other Comments: Pt states she lives at home with her son and dad (however dad is deceased)    Therapist, art Assessed: Yes Dynamic Standing Balance Dynamic Standing - Balance Support: Left upper extremity supported;During functional activity Dynamic Standing - Level of Assistance: 5: Stand by assistance Dynamic Standing - Balance Activities: Lateral lean/weight shifting;Forward lean/weight shifting Dynamic Standing - Comments: Pt does very well standing  at [facility] and using RUE to assist with self care activity.    End of Session PT - End of Session Equipment Utilized During Treatment: Gait belt;Oxygen Activity Tolerance: Patient tolerated  treatment well Patient left: in chair;with call bell/phone within reach Nurse Communication: Mobility status   GP     Vista Deck 02/21/2013, 4:06 PM

## 2013-02-22 ENCOUNTER — Encounter (HOSPITAL_COMMUNITY): Payer: Medicare Other

## 2013-02-22 ENCOUNTER — Other Ambulatory Visit: Payer: Self-pay | Admitting: *Deleted

## 2013-02-22 ENCOUNTER — Other Ambulatory Visit: Payer: Self-pay | Admitting: Oncology

## 2013-02-22 ENCOUNTER — Telehealth: Payer: Self-pay | Admitting: *Deleted

## 2013-02-22 ENCOUNTER — Encounter (HOSPITAL_COMMUNITY): Admission: RE | Admit: 2013-02-22 | Payer: Medicare Other | Source: Ambulatory Visit

## 2013-02-22 DIAGNOSIS — C773 Secondary and unspecified malignant neoplasm of axilla and upper limb lymph nodes: Secondary | ICD-10-CM

## 2013-02-22 DIAGNOSIS — R0609 Other forms of dyspnea: Secondary | ICD-10-CM

## 2013-02-22 DIAGNOSIS — C50911 Malignant neoplasm of unspecified site of right female breast: Secondary | ICD-10-CM

## 2013-02-22 DIAGNOSIS — C7931 Secondary malignant neoplasm of brain: Secondary | ICD-10-CM

## 2013-02-22 DIAGNOSIS — R0602 Shortness of breath: Secondary | ICD-10-CM

## 2013-02-22 MED ORDER — HYDRALAZINE HCL 20 MG/ML IJ SOLN
5.0000 mg | Freq: Once | INTRAMUSCULAR | Status: AC
  Administered 2013-02-22: 5 mg via INTRAVENOUS
  Filled 2013-02-22: qty 1

## 2013-02-22 MED ORDER — ANASTROZOLE 1 MG PO TABS
1.0000 mg | ORAL_TABLET | Freq: Every day | ORAL | Status: DC
  Administered 2013-02-22 – 2013-02-23 (×2): 1 mg via ORAL
  Filled 2013-02-22 (×2): qty 1

## 2013-02-22 NOTE — Progress Notes (Signed)
Clinical Social Work Department BRIEF PSYCHOSOCIAL ASSESSMENT 02/22/2013  Patient:  [name], [name]     Account Number:  [id number]     Admit date:  02/17/2013  Clinical Social Worker:  Orpah Greek  Date/Time:  02/22/2013 04:42 PM  Referred by:  Physician  Date Referred:  02/22/2013 Referred for  SNF Placement   Other Referral:   Interview type:  Patient Other interview type:   and son, [name]    PSYCHOSOCIAL DATA Living Status:  WITH ADULT CHILDREN Admitted from facility:   Level of care:   Primary support name:  [name] (son) ph#: [phone] Primary support relationship to patient:  CHILD, ADULT Degree of support available:   good    CURRENT CONCERNS Current Concerns  Post-Acute Placement   Other Concerns:    SOCIAL WORK ASSESSMENT / PLAN CSW spoke with patient's son, [name] (cell#: [phone]) re: discharge planning. Patient's son requested that patient go to SNF at discharge for rehab before returning home & requested [facility].   Assessment/plan status:  Information/Referral to [facility] Other assessment/ plan:   Information/referral to community resources:   CSW completed FL2 and faxed information out to [facility] - confirmed with [facility] that they would be able to offer a bed.    PATIENT'S/FAMILY'S RESPONSE TO PLAN OF CARE: Patient's son was very pleased to hear that [facility] would be able to offer a bed & plans to transport his mom when he gets out of dialysis tomorrow if she is discharged.        Unice Bailey, LCSW [facility] Clinical Social Worker cell #: [phone]

## 2013-02-22 NOTE — Progress Notes (Signed)
Occupational Therapy Treatment Patient Details Name: [name] MRN: [id number] DOB: 1931/10/08 Today's Date: 02/22/2013 Time: 7829-5621 OT Time Calculation (min): 27 min  OT Assessment / Plan / Recommendation Comments on Treatment Session      Follow Up Recommendations  SNF (pt states she's planning on [facility])--would benefit from this.  Could also go home with 24/7 assist and HHOT if family can manage   Barriers to Discharge       Equipment Recommendations  3 in 1 bedside comode    Recommendations for Other Services    Frequency Min 2X/week   Plan      Precautions / Restrictions Precautions Precautions: Fall Precaution Comments: Monitor O2 and HR (HR up to 147 w/ amb), LUE lymphedema Restrictions Weight Bearing Restrictions: No   Pertinent Vitals/Pain No pain reported.  Sats 98- 100% on 2 to 2 1/2 liters.  HR 105 - 127    ADL  Grooming: Brushing hair;Supervision/safety Where Assessed - Grooming: Supported standing Lower Body Dressing: Minimal assistance (reacher and sock aid) Where Assessed - Lower Body Dressing: Supported sit to Pharmacist, hospital: Hydrographic surveyor Method: Surveyor, minerals: Bedside commode Equipment Used: Rolling walker;Sock aid;Reacher Transfers/Ambulation Related to ADLs: ambulated to bathroom, closet and window with rest breaks given.  sats 98-100% on 2 to 2.5 liters 02 ADL Comments: continued education re:  energy conservation.  Educated on AE and pt practiced with this.  Pt did initiate one rest break independently    OT Diagnosis:    OT Problem List:   OT Treatment Interventions:     OT Goals ADL Goals Pt Will Perform Lower Body Dressing: Sit to stand from chair;with adaptive equipment;with supervision ADL Goal: Lower Body Dressing - Progress: Updated due to goal met Pt Will Transfer to Toilet: Ambulation;3-in-1;with supervision ADL Goal: Toilet Transfer - Progress: Updated due to goal  met Miscellaneous OT Goals Miscellaneous OT Goal #2: Pt will incorporate at least one rest break per session for energy conservation OT Goal: Miscellaneous Goal #2 - Progress: Goal set today  Visit Information  Last OT Received On: 02/22/13 Assistance Needed: +1    Subjective Data      Prior Functioning       Cognition  Cognition Overall Cognitive Status: Appears within functional limits for tasks assessed/performed Behavior During Session: [facility] for tasks performed Cognition - Other Comments: RN states pt has had some intermittent confusion.  She was wfls during OT    Mobility  Bed Mobility Supine to Sit: 5: Supervision;HOB elevated;With rails Transfers Sit to Stand: 5: Supervision;From bed;From chair/3-in-1;With upper extremity assist Details for Transfer Assistance: cues for hand placement    Exercises      Balance Dynamic Standing Balance Dynamic Standing - Balance Support: Left upper extremity supported;During functional activity Dynamic Standing - Level of Assistance: 5: Stand by assistance   End of Session OT - End of Session Activity Tolerance: Patient tolerated treatment well Patient left: in bed;with call bell/phone within reach;with bed alarm set  GO     [name] 02/22/2013, 4:13 PM Marica Otter, OTR/L [phone] 02/22/2013

## 2013-02-22 NOTE — Progress Notes (Signed)
ID: [name]   DOB: December 27, 1930  MR#: [id number]  CSN#:[id number]  PCP: [name], MD GYN:  SU:  OTHER MD: Drue Second   HISTORY OF PRESENT ILLNESS: The patient tells me she "felt bac" sometime in 2010 and eventually sought medical treatment. She was found to have a stage III invasive ductal breast cancer, and treated with mastectomy and axillary dissectionfor her T2 N2, grade 2 tumor. She received post-mastectomy irradiation then saw Dr Pierce Crane and was treated with letrozole/ Femara fo perhaps a year before dropping out of follow-up  More recently the patient presented with a chest rash, lymphedema and worsening shortness of breath. 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 Biopsies of the breast and axilla on the Right confirmed invasive ductal carcinoma, grade 3, estrogen and progesterone receptor positive, HER-2 negative.  The patient is now referred for consideration of Hospice care.  REVIEW OF SYSTEMS: [name] denies headaches, nausea or vomiting. She "can't walk well." Her breathing is "better" since she came ot the hospital. She is not aware of issues relating to urine and stool incontinenece. She denies pain. No family in room.  PAST MEDICAL HISTORY: Past Medical History  Diagnosis Date  . Hypertension   . Diabetes mellitus without complication   . Breast cancer     PAST SURGICAL HISTORY: Past Surgical History  Procedure Laterality Date  . Breast surgery      SOCIAL HISTORY: Patient lives at home with husband and son, who is the primary caregiver (though he is himself on dialysis). It is not clear at this point whether the patient will be able to return home given her poor functional status and multiple medical/nursing needs   ADVANCED DIRECTIVES: DNR in place  HEALTH MAINTENANCE: History  Substance Use Topics  . Smoking status: Current Every  Day Smoker  . Smokeless tobacco: Never Used  . Alcohol Use: No     Colonoscopy:  PAP:  Bone density:  Lipid panel:  No Known Allergies  Current Facility-Administered Medications  Medication Dose Route Frequency Provider Last Rate Last Dose  . ipratropium (ATROVENT) nebulizer solution 0.5 mg  0.5 mg Nebulization Q6H PRN Pamella Pert, MD   0.5 mg at 02/21/13 0451   And  . albuterol (PROVENTIL) (5 MG/ML) 0.5% nebulizer solution 2.5 mg  2.5 mg Nebulization Q6H PRN Pamella Pert, MD   2.5 mg at 02/21/13 0451  . bisacodyl (DULCOLAX) suppository 10 mg  10 mg Rectal Daily PRN Lorinda Creed, NP      . heparin injection 5,000 Units  5,000 Units Subcutaneous Q8H Pamella Pert, MD   5,000 Units at 02/22/13 0539  . HYDROcodone-acetaminophen (NORCO/VICODIN) 5-325 MG per tablet 1 tablet  1 tablet Oral Q4H PRN Leanne Chang, NP   1 tablet at 02/21/13 2228  . LORazepam (ATIVAN) tablet 0.5 mg  0.5 mg Oral Q6H PRN Jinger Neighbors, NP   0.5 mg at 02/22/13 1018  . pantoprazole (PROTONIX) EC tablet 40 mg  40 mg Oral Daily Pamella Pert, MD   40 mg at 02/22/13 0917  . predniSONE (DELTASONE) tablet 40 mg  40 mg Oral Q breakfast Pamella Pert, MD   40 mg at 02/22/13 0917  . simvastatin (ZOCOR) tablet 20 mg  20 mg Oral QPM Pamella Pert, MD   20 mg at 02/21/13 2017  . sodium chloride 0.9 % injection 3 mL  3 mL Intravenous Q12H Costin Gherghe,  MD   3 mL at 02/21/13 1000    OBJECTIVE: elderly African American woman examined in bed Filed Vitals:   02/22/13 0540  BP: 140/66  Pulse: 108  Temp: 98.5 F (36.9 C)  Resp: 16     Body mass index is 35.11 kg/(m^2).    ECOG FS: 2-3  Sclerae unicteric Oropharynx clear Lungs no crackles or wheezes, examined anterolaterally, poor excursion bilaterally Heart regular rate and rhythm Abd obese, benign MSK 2+ left upper extremity edema Neuro: nonfocal, well-oriented, pleasant affect Breasts: left breast is s/p mastectomy; there are obvious tumor nodules across  the anterior chest wall; left breast skin is thickened and darkened; there is fixed R axillary adenopathy   LAB RESULTS: Lab Results  Component Value Date   WBC 8.6 02/19/2013   NEUTROABS 3.5 02/17/2013   HGB 10.6* 02/19/2013   HCT 32.1* 02/19/2013   MCV 85.4 02/19/2013   PLT 452* 02/19/2013    @LASTCHEMISTRY @  Lab Results  Component Value Date   LABCA2 27 10/15/2009    No components found with this basename: [id number]    No results found for this basename: INR,  in the last 168 hours  Urinalysis    Component Value Date/Time   COLORURINE YELLOW 02/17/2013 2235   APPEARANCEUR CLEAR 02/17/2013 2235   LABSPEC 1.016 02/17/2013 2235   PHURINE 5.0 02/17/2013 2235   GLUCOSEU NEGATIVE 02/17/2013 2235   HGBUR NEGATIVE 02/17/2013 2235   BILIRUBINUR NEGATIVE 02/17/2013 2235   KETONESUR NEGATIVE 02/17/2013 2235   PROTEINUR NEGATIVE 02/17/2013 2235   UROBILINOGEN 0.2 02/17/2013 2235   NITRITE NEGATIVE 02/17/2013 2235   LEUKOCYTESUR NEGATIVE 02/17/2013 2235    STUDIES: Dg Chest 2 View  02/17/2013  *RADIOLOGY REPORT*  Clinical Data: Cough and shortness of breath.  Hypertension and diabetes.  CHEST - 2 VIEW  Comparison: CT 04/01/2011 and plain film 11/20/2009  Findings: Surgical clips within the left axilla.  Patient rotated left. Cardiomegaly accentuated by AP portable technique.  Small to moderate left-sided pleural effusion. No pneumothorax.  Low lung volumes with resultant pulmonary interstitial prominence.  Left sided airspace disease which is new.  IMPRESSION: Left-sided pleural effusion and adjacent infection or atelectasis. Recommend radiographic follow-up until clearing.  Cardiomegaly with low lung volumes on the frontal.  Interstitial prominence is favored to be secondary.  Concurrent mild pulmonary venous congestion is difficult to exclude.   Original Report Authenticated By: Jeronimo Greaves, M.D.    Ct Chest Wo Contrast  02/18/2013  *RADIOLOGY REPORT*  Clinical Data: Shortness of breath.  Pleural  effusion.  History of left-sided breast cancers status post left mastectomy, chemotherapy and radiation therapy now complete.  CT CHEST WITHOUT CONTRAST  Technique:  Multidetector CT imaging of the chest was performed following the standard protocol without IV contrast.  Comparison: Chest CT 04/01/2011.  Findings:  Mediastinum: Extensive anterior mediastinal lymphadenopathy is now noted, with the largest lymph nodes measuring up to 1.4-1.7 cm in short axis. Enlarged lymph nodes are also noted the superior mediastinum immediately anterior to the origin of the left common carotid artery measuring up to 1.5 cm in short axis.  Heart size is normal. There is no significant pericardial fluid, thickening or pericardial calcification. There is atherosclerosis of the thoracic aorta, the great vessels of the mediastinum and the coronary arteries, including calcified atherosclerotic plaque in the left main, left anterior descending and right coronary arteries. Esophagus is unremarkable in appearance.  Lungs/Pleura: Although difficult to assess for certain on the noncontrast CT examination, there appears  to be multifocal pleural- based nodularity in the left hemithorax, best demonstrated on image 39 of series 2 where there is an apparent 2.6 x 1.4 cm pleural based nodule in the medial aspect of the left hemithorax.  There is a new moderate left sided pleural effusion which is likely malignant.  Extensive passive atelectasis in the left lower lobe which obscures assessment of the lung parenchyma for underlying pulmonary nodules.  Subpleural reticulation in the anterolateral aspect of the left upper lobe is likely related to a prior left- sided breast radiation therapy.  No definite suspicious appearing pulmonary nodules or masses are noted within the aerated portions of the lungs, although there are a few scattered 1-2 mm pulmonary nodules which are highly nonspecific.  Upper Abdomen: Soft tissue in the upper retroperitoneum  immediately anterolateral to the aorta (image 59 of series 2) measuring 3.0 x 2.1 cm, suspicious for lymphadenopathy.  Additional smaller soft tissue lesions are also noted throughout the visualized upper abdomen.  Extensive edema is noted throughout the left upper abdominal wall anteriorly and laterally.  Musculoskeletal: Status post left modified radical mastectomy and axillary nodal dissection.  There is extensive skin thickening throughout the right breast, and edema throughout the right breast as well as the chest wall bilaterally.  In the right axilla there are numerous enlarged lymph nodes, including the largest node which is 4.7 x 3.8 cm.  Extensive subpectoral lymphadenopathy is also noted.  There are no aggressive appearing lytic or blastic lesions noted in the visualized portions of the skeleton.  IMPRESSION: 1.  Findings, as above, highly concerning for possible inflammatory breast cancer in the right breast, with extensive right axillary, subpectoral, mediastinal and retroperitoneal lymphadenopathy. There is also likely a malignant left pleural effusion. 2. Atherosclerosis, including left main and two-vessel coronary artery disease. 3.  Additional incidental findings, as above.  These results were called by telephone on 02/18/2013 at 03:20 p.m. to Dr. Elvera Lennox, who verbally acknowledged these results.   Original Report Authenticated By: Trudie Reed, M.D.    Dg Chest Port 1 View  02/20/2013  *RADIOLOGY REPORT*  Clinical Data: Follow up left lower lobe atelectasis and/or pneumonia and left pleural effusion.  PORTABLE CHEST - 1 VIEW 02/20/2013 0819 hours:  Comparison: Portable chest x-ray and CT chest yesterday.  Two-view chest x-ray 02/17/2013.  Findings: Persistent dense consolidation in the left lower lobe and moderate-sized left pleural effusion, unchanged.  Cardiac silhouette normal in size, unchanged.  Mild pulmonary venous hypertension without overt edema.  Prior left axillary node dissection.   IMPRESSION: Stable dense left lower lobe atelectasis or pneumonia and moderate- sized left pleural effusion.  Developing pulmonary venous hypertension without overt edema, query incipient fluid overload.   Original Report Authenticated By: Hulan Saas, M.D.    Portable Chest 1 View  02/18/2013  *RADIOLOGY REPORT*  Clinical Data: Pleural effusion.  PORTABLE CHEST - 1 VIEW  Comparison: 02/17/2013.  Findings: Trachea is midline.  Heart size grossly stable. Bihilar prominence.  Left basilar collapse/consolidation and left pleural effusion appear unchanged. Postoperative changes of left mastectomy.  Surgical clips in the left axilla.  IMPRESSION:  Left lower lobe collapse/consolidation and left pleural effusion with left hilar prominence.  A centrally obstructing mass with postobstructive pneumonitis/pneumonia could have this appearance. CT chest with contrast would be helpful in further evaluation, as clinically indicated.   Original Report Authenticated By: Leanna Battles, M.D.     ASSESSMENT: 79 y.o. [place] woman  (1) s/p Left lumpectomy and axillary lymph node dissection  05/03/2009 for a pT2 pN2 invasive ductal carcinoma, grade 2, estrogen receptor 100% positive, progesterone receptor 99% positive, with an Mib-1 of 29% and no HER-2 amplification  (2) s/p Left simple mastectomy 06/04/2009 to clear margins  (3) s/p post-mastectomy irradiation to the Left chest wall  (4) treated adjuvantly with letrozole approximately one year (per patient's recollection)  (5) s/p Right axillary lymph node biopsy 01/20/2013 for invasive ductal carcinoma, estrogen receptor 100% positive, progesterone receptor 27% positive, with an Mib-1 of 47% and no HER-2 amplification  (6) s/p Right breast biopsy 01/31/2013 showing invasive ductal breast cancer, grade 3, with a similar profile to the Right axillary biopsy above  (7) chest CT w/o contrast 02/18/2013 shows stage IV disease, with extensive adenopathy  (including mediastinum and neck) as well as apparent pleural spread on the Left; there is also extensive skin involvement  PLAN: . While stage IV breast cancer is not curable, it is treatable. This patient does not have immediately life threatening disease. She is a good candidate for anti-estrogen treatment. She tolerated letrozole well before by her account, and I would start anastrozole at this time. She will need to be followed as outpatient and I will arrange an appointment with Dr Welton Flakes in approximately a month.  In my opinion, Mrs. Colquitt is not currently a Hospice candidate.  Note that while treatment is likely to extend the patient's survival, it is unlikely to significantly improve her limited functional status (which is due to multiple chronic problems). Patient may benefit from SNF placement if the family does not feel she can be cared for safely and comfortably at home.  Please let me know if I can be of further help at this point.   Terrilynn Postell C    02/22/2013

## 2013-02-22 NOTE — Telephone Encounter (Signed)
Pt is in the hospital will place cal/letter in the mail...td

## 2013-02-22 NOTE — Progress Notes (Signed)
[facility] PROGRESS NOTE  [name] AVW:[id number] DOB: May 16, 1931 DOA: 02/17/2013 PCP: [name], MD  Brief Narrative: [name] is a 79 y.o. female has a past medical history significant for breast cancer status post resection in 2010 (left mastectomy status post radiation treatment is well), lost to followup until December of 2013 or ultrasound shows recurrence of her cancer on the right side. 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 She also has a right-sided skin thickening c/w inflammatory changes and also has been having left sided arm lymphedema for the past month. For the last month she's been complaining of worsening shortness or breath and a productive cough. She saw her PCP about a month ago, and he gave her a prescription for amoxicillin for 10 days. She took the prescription without improvement in her symptoms. She has been having more wheezing as well. She is using albuterol about twice a day, and she ran out of his medication last night. When she woke up this morning she felt short of breath (somewhat similar to her previous breathing difficulties) however she had no inhalers and her breathing difficulties haven't improved and she decided to call EMS. She denies any fevers or chills, she denies any chest pain. She has no lightheadedness or dizziness. Has no abdominal pain, nausea or vomiting. She denies any chest pain with deep inspiration. She endorses weight loss, unable to quantify how much.  Assessment/Plan:  Breast cancer - With recurrence on the right. Radiation oncology has been consulted, I talked to Dr. Dayton Scrape today over the phone and he feels like there is little to offer at this point.  - palliative has been consulted and patient is very open about her condition however would like to talk to Medical oncology before she makes further decisions.  - I have asked Dr. Darnelle Catalan for his opinion since her primary oncologist is not  available for the time being, appreciate his input.    Shortness of breath due to Community-acquired pneumonia vs malignant pleural effusion - On Ceftriaxone/Azithromycin, discontinued today since her breathing difficulties are likely due to pleural effusion - wheezing improved.  - Duonebs every 6 hours.   Left sided pleural effusion - Most likely malignant effusion due to known cancer. - Consult to pulmonology 4/11; did not feel like a thoracentesis would be beneficial at this point  Cardiomegaly on chest x-ray  - She has no known history of heart disease, 2D echo unremarkable - troponin negative, EKG shows sinus rhythm.   Mild anemia   - likely due to active malignancy   DVT Prophylaxis  - heparin s.q.  Code Status: DNR Family Communication: none  Disposition Plan: Skilled nursing facility hopefully 4/16  Consultants:  Pulmonology  Rad Onc  Medical Oncology  Procedures:  2D echo Study Conclusions - Left ventricle: The cavity size was normal. Systolic function was normal. The estimated ejection fraction was in the range of 60% to 65%. Wall motion was normal; there were no regional wall motion abnormalities. Mitral valve: Mild regurgitation. Pericardium, extracardiac: A trivial pericardial effusion was identified.  Antibiotics:  Ceftriaxone/Azithromycin 4/10 >> 4/15  HPI/Subjective: - feeling well, asking to talk to oncology before makes further palliative decisions.   Objective: Filed Vitals:   02/21/13 2145 02/22/13 0106 02/22/13 0534 02/22/13 0540  BP: 166/79 141/52  140/66  Pulse: 104 100  108  Temp: 99 F (37.2 C)   98.5 F (36.9 C)  TempSrc: Oral   Oral  Resp: 16   16  Height:      Weight:   87.1 kg (192 lb 0.3 oz)   SpO2: 98%   99%    Intake/Output Summary (Last 24 hours) at 02/22/13 0829 Last data filed at 02/22/13 0600  Gross per 24 hour  Intake    520 ml  Output    350 ml  Net    170 ml   Filed Weights   02/21/13 0500 02/21/13 0610  02/22/13 0534  Weight: 86.9 kg (191 lb 9.3 oz) 86.9 kg (191 lb 9.3 oz) 87.1 kg (192 lb 0.3 oz)    Exam:  General:  NAD  Cardiovascular: regular rate and rhythm, without MRG  Respiratory: good air movement, no wheezing this morning, decreased breath sounds on left lower lung  Abdomen: soft, not tender to palpation, positive bowel sounds  MSK: trace peripheral edema; left arm swollen, unchanged since last night  Neuro: CN 2-12 grossly intact, MS 5/5 in all 4  Data Reviewed: Basic Metabolic Panel:  Recent Labs Lab 02/17/13 0935 02/18/13 0430 02/19/13 0515 02/20/13 0513 02/21/13 0436  NA 134* 134* 135 135 134*  K 4.6 4.3 4.4 4.8 4.6  CL 99 98 99 97 97  CO2 19 26 24 26 27   GLUCOSE 137* 213* 119* 131* 141*  BUN 21 30* 33* 31* 32*  CREATININE 1.39* 1.53* 1.48* 1.15* 1.19*  CALCIUM 8.8 9.3 9.4 9.7 9.3   Liver Function Tests:  Recent Labs Lab 02/17/13 0935 02/18/13 0430  AST 22 16  ALT 7 7  ALKPHOS 80 80  BILITOT 0.2* 0.1*  PROT 6.7 6.8  ALBUMIN 2.3* 2.5*   CBC:  Recent Labs Lab 02/17/13 0935 02/18/13 0430 02/19/13 0515  WBC 5.7 4.0 8.6  NEUTROABS 3.5  --   --   HGB 10.9* 10.3* 10.6*  HCT 32.8* 31.7* 32.1*  MCV 87.5 86.1 85.4  PLT 391 473* 452*   Cardiac Enzymes:  Recent Labs Lab 02/17/13 0935 02/20/13 0801 02/20/13 1328 02/20/13 1932  TROPONINI <0.30 <0.30 <0.30 <0.30   BNP (last 3 results)  Recent Labs  02/17/13 0935  PROBNP 4967.0*   Studies: No results found. Scheduled Meds: . azithromycin  500 mg Oral Daily  . cefTRIAXone (ROCEPHIN) IVPB 1 gram/50 mL D5W  1 g Intravenous Q24H  . heparin  5,000 Units Subcutaneous Q8H  . pantoprazole  40 mg Oral Daily  . predniSONE  40 mg Oral Q breakfast  . simvastatin  20 mg Oral QPM  . sodium chloride  3 mL Intravenous Q12H   Continuous Infusions:   Active Problems:   Breast cancer, right breast   Lymphedema, left arm   Cancer of upper-outer quadrant of female breast   Pleural effusion on  left   Collapse of left lung   Weakness generalized   Dyspnea  Time spent: 15  Pamella Pert, MD [facility] Pager [phone]. If 7 PM - 7 AM, please contact night-coverage at [email or web address], password [facility] 02/22/2013, 8:29 AM  LOS: 5 days

## 2013-02-22 NOTE — Progress Notes (Signed)
Patient screened for [facility] Care Management services. Spoke with patient at bedside regarding [facility] Care Management services. However, she is a bit confused. Left brochure at bedside for family to review.  Raiford Noble, MSN- Ed, RN, BSN, [facility], [phone]

## 2013-02-22 NOTE — Progress Notes (Signed)
Progress Note from the Palliative Medicine Team at [facility]  Subjective: pateint is alert and oriented X3, successful 95% cognitive testing   -both sons  [name] and Streve  at bedside, continued conversation regarding GOC as it relates to medical interventions, options, disposition and anticipatory care needs     Objective: No Known Allergies Scheduled Meds: . anastrozole  1 mg Oral Daily  . heparin  5,000 Units Subcutaneous Q8H  . pantoprazole  40 mg Oral Daily  . predniSONE  40 mg Oral Q breakfast  . simvastatin  20 mg Oral QPM  . sodium chloride  3 mL Intravenous Q12H   Continuous Infusions:  PRN Meds:.albuterol, bisacodyl, HYDROcodone-acetaminophen, ipratropium, LORazepam  BP 163/68  Pulse 121  Temp(Src) 98.9 F (37.2 C) (Oral)  Resp 18  Ht 5\' 2"  (1.575 m)  Wt 87.1 kg (192 lb 0.3 oz)  BMI 35.11 kg/m2  SpO2 94%   PPS:30 %  Pain Score:denies   Intake/Output Summary (Last 24 hours) at 02/22/13 1452 Last data filed at 02/22/13 1240  Gross per 24 hour  Intake    950 ml  Output    500 ml  Net    450 ml       Physical Exam: General: chronically ill appearing, NAD  HEENT: MM, no exudate  Chest:  diminished in bases L>R, CTA  Skin: noted blistered rash over chest wall Rside >L and left upper arm and at resection site, non painful, non pruritic  CVS: RRR  Abdomen: obese soft NT +BS  Ext: Left arm with lymphedema  Neuro:alert and oriented X3  Psych: engaged, cooperative, with insight to present situation      Labs: CBC    Component Value Date/Time   WBC 8.6 02/19/2013 0515   WBC 6.2 02/10/2013 1502   RBC 3.76* 02/19/2013 0515   RBC 3.84 02/10/2013 1502   HGB 10.6* 02/19/2013 0515   HGB 11.1* 02/10/2013 1502   HCT 32.1* 02/19/2013 0515   HCT 34.4* 02/10/2013 1502   PLT 452* 02/19/2013 0515   PLT 429* 02/10/2013 1502   MCV 85.4 02/19/2013 0515   MCV 89.5 02/10/2013 1502   MCH 28.2 02/19/2013 0515   MCH 28.9 02/10/2013 1502   MCHC 33.0 02/19/2013 0515   MCHC 32.3  02/10/2013 1502   RDW 15.3 02/19/2013 0515   RDW 15.5* 02/10/2013 1502   LYMPHSABS 1.1 02/17/2013 0935   LYMPHSABS 0.7* 02/10/2013 1502   MONOABS 0.7 02/17/2013 0935   MONOABS 0.4 02/10/2013 1502   EOSABS 0.4 02/17/2013 0935   EOSABS 0.2 02/10/2013 1502   BASOSABS 0.0 02/17/2013 0935   BASOSABS 0.1 02/10/2013 1502    BMET    Component Value Date/Time   NA 134* 02/21/2013 0436   NA 137 02/10/2013 1502   K 4.6 02/21/2013 0436   K 4.7 02/10/2013 1502   CL 97 02/21/2013 0436   CL 105 02/10/2013 1502   CO2 27 02/21/2013 0436   CO2 20* 02/10/2013 1502   GLUCOSE 141* 02/21/2013 0436   GLUCOSE 103* 02/10/2013 1502   BUN 32* 02/21/2013 0436   BUN 23.5 02/10/2013 1502   CREATININE 1.19* 02/21/2013 0436   CREATININE 1.7* 02/10/2013 1502   CREATININE 1.41* 01/27/2013 1517   CALCIUM 9.3 02/21/2013 0436   CALCIUM 9.4 02/10/2013 1502   GFRNONAA 42* 02/21/2013 0436   GFRAA 49* 02/21/2013 0436    CMP     Component Value Date/Time   NA 134* 02/21/2013 0436   NA 137 02/10/2013 1502  K 4.6 02/21/2013 0436   K 4.7 02/10/2013 1502   CL 97 02/21/2013 0436   CL 105 02/10/2013 1502   CO2 27 02/21/2013 0436   CO2 20* 02/10/2013 1502   GLUCOSE 141* 02/21/2013 0436   GLUCOSE 103* 02/10/2013 1502   BUN 32* 02/21/2013 0436   BUN 23.5 02/10/2013 1502   CREATININE 1.19* 02/21/2013 0436   CREATININE 1.7* 02/10/2013 1502   CREATININE 1.41* 01/27/2013 1517   CALCIUM 9.3 02/21/2013 0436   CALCIUM 9.4 02/10/2013 1502   PROT 6.8 02/18/2013 0430   PROT 7.4 02/10/2013 1502   ALBUMIN 2.5* 02/18/2013 0430   ALBUMIN 2.3* 02/10/2013 1502   AST 16 02/18/2013 0430   AST 18 02/10/2013 1502   ALT 7 02/18/2013 0430   ALT 8 02/10/2013 1502   ALKPHOS 80 02/18/2013 0430   ALKPHOS 96 02/10/2013 1502   BILITOT 0.1* 02/18/2013 0430   BILITOT 0.25 02/10/2013 1502   GFRNONAA 42* 02/21/2013 0436   GFRAA 49* 02/21/2013 0436    Assessment and Plan: 1. Code Status: DNR/DNI 2. Symptom Control: 1. Anxiety/Agitation: Ativan 0.5 mg every 6 hr prn (ordered by attending previously) 2. Pain:  Vicodin 5-325 mg every 4 hrs prn (ordered by attending previously) 3. Bowel Regimen:dulcolax supp                    4.   Weakness/Fatigue: SNF for dc,     3. Psycho/Social: Emotional support offered to patient and family, this gets more and more difficult for all as the disease progresses.  Both patient and family are in support for dc to SNF for rehab with Palliative to follow   4. Disposition: SNF for rehab with Palliative to follow.  I personally assisted with making an appointment with Dr Welton Flakes for April 24th at 3:15 at the cancer center, to arrive early for blood work.   Patient Documents Completed or Given: Document Given Completed  Advanced Directives Pkt yes   MOST    DNR    Gone from My Sight    Hard Choices      Time In Time Out Total Time Spent with Patient Total Overall Time  1215 1300 45 min 45 min    Greater than 50%  of this time was spent counseling and coordinating care related to the above assessment and plan.  Lorinda Creed NP  Palliative Medicine Team Team Phone # [phone] Pager [phone]  Discussed with Dr Lafe Garin and Dr Darnelle Catalan 1

## 2013-02-23 DIAGNOSIS — C50419 Malignant neoplasm of upper-outer quadrant of unspecified female breast: Secondary | ICD-10-CM

## 2013-02-23 LAB — BASIC METABOLIC PANEL
BUN: 37 mg/dL — ABNORMAL HIGH (ref 6–23)
Calcium: 9.6 mg/dL (ref 8.4–10.5)
GFR calc Af Amer: 50 mL/min — ABNORMAL LOW (ref >90)
GFR calc non Af Amer: 43 mL/min — ABNORMAL LOW (ref >90)
Glucose, Bld: 147 mg/dL — ABNORMAL HIGH (ref 70–99)
Potassium: 4.7 mEq/L (ref 3.5–5.1)
Sodium: 131 mEq/L — ABNORMAL LOW (ref 135–145)

## 2013-02-23 MED ORDER — METOPROLOL TARTRATE 1 MG/ML IV SOLN
5.0000 mg | Freq: Once | INTRAVENOUS | Status: AC
  Administered 2013-02-23: 5 mg via INTRAVENOUS

## 2013-02-23 MED ORDER — PREDNISONE 20 MG PO TABS: ORAL_TABLET | ORAL | Status: DC

## 2013-02-23 MED ORDER — LORAZEPAM 0.5 MG PO TABS: 0.5000 mg | ORAL_TABLET | Freq: Four times a day (QID) | ORAL | Status: DC | PRN

## 2013-02-23 MED ORDER — BISACODYL 10 MG RE SUPP: 10.0000 mg | Freq: Every day | RECTAL | Status: DC | PRN

## 2013-02-23 MED ORDER — ANASTROZOLE 1 MG PO TABS: 1.0000 mg | ORAL_TABLET | Freq: Every day | ORAL | Status: DC

## 2013-02-23 MED ORDER — HYDROCODONE-ACETAMINOPHEN 5-325 MG PO TABS: 1.0000 | ORAL_TABLET | ORAL | Status: DC | PRN

## 2013-02-23 MED ORDER — METOPROLOL TARTRATE 1 MG/ML IV SOLN
INTRAVENOUS | Status: AC
  Filled 2013-02-23: qty 5

## 2013-02-23 NOTE — Discharge Summary (Addendum)
Physician Discharge Summary  [name] UJW:[id number] DOB: 05-Jul-1931 DOA: 02/17/2013  PCP: [name], MD  Admit date: 02/17/2013 Discharge date: 02/23/2013  Recommendations for Outpatient Follow-up:  1. Pt will need to follow up with PCP in 2-3 weeks post discharge 2. Please obtain BMP to evaluate electrolytes and kidney function 3. Please also check CBC to evaluate Hg and Hct levels  4. Please note that an anastrozole was started per oncology recommendation 5. Please also note that patient has completed course of antibiotics Zithromax and Rocephin during the hospital stay for presumptive community-acquired pneumonia  Discharge Diagnoses: Breast cancer, right, community-acquired pneumonia, malignant pleural effusion Active Problems:   Breast cancer, right breast   Lymphedema, left arm   Cancer of upper-outer quadrant of female breast   Pleural effusion on left   Collapse of left lung   Weakness generalized   Dyspnea  Discharge Condition: Stable  Diet recommendation: Heart healthy diet discussed in details   Brief Narrative:  [name] is a 79 y.o. female with past medical history significant for breast cancer status post resection in 2010 (left mastectomy status post radiation treatment is well), lost to followup until December of 2013 on ultrasound shows recurrence of her cancer on the right side. 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 She also has a right-sided skin thickening c/w inflammatory changes and also has been having left sided arm lymphedema for the past month. For the last month she's been complaining of worsening shortness or breath and a productive cough. She saw her PCP about a month ago, and he gave her a prescription for amoxicillin for 10 days. She took the prescription without improvement in her symptoms. She has been having more wheezing as well. She is using albuterol about twice a day, and she ran out of his medication last  night. When she woke up this morning she felt short of breath (somewhat similar to her previous breathing difficulties) however she had no inhalers and her breathing difficulties haven't improved and she decided to call EMS. She denied any fevers or chills, chest pain. She had no lightheadedness or dizziness, no abdominal pain, nausea or vomiting. She denied any chest pain with deep inspiration. She endorses weight loss, unable to quantify how much.   Assessment/Plan:  Breast cancer  - With recurrence on the right. Radiation oncology has been consulted, we have discussed the case with Dr. Dayton Scrape over the phone and he feels like there is little to offer at this point.  - Appreciate oncology input, per their recommendation patient should not be considered hospice eligible at this time as treatment can be provided, treatment is not going to be curative but breast cancer is treatable per oncologist  - Patient was started on anastrozole as per oncology recommendations, patient's  primary oncologist was not available during this hospital stay so patient will have to followup in outpatient setting Shortness of breath due to Community-acquired pneumonia vs malignant pleural effusion  - On Ceftriaxone/Azithromycin, discontinued 04/15 since her breathing difficulties are likely due to pleural effusion  - wheezing improved.  Left sided pleural effusion  - Most likely malignant effusion due to known cancer.  - Consult to pulmonology 4/11; did not feel like a thoracentesis would be beneficial at this point  Cardiomegaly on chest x-ray  - She has no known history of heart disease, 2D echo unremarkable  - troponin negative, EKG shows sinus rhythm.  Mild anemia  - likely due to active malignancy  DVT  Prophylaxis  - heparin s.q. during inpatient  Code Status: DNR  Disposition Plan: Skilled nursing facility 4/16   Consultants:  Pulmonology  Rad Onc  Medical Oncology Procedures:  2D echo - Left ventricle:  The cavity size was normal. Systolic function was normal. The estimated ejection fraction was in the range of 60% to 65%. Wall motion was normal; there were no regional wall motion abnormalities. Mitral valve: Mild regurgitation. Pericardium, extracardiac: A trivial pericardial effusion was identified.  Antibiotics:  Ceftriaxone/Azithromycin 4/10 >> 4/15   Discharge Exam: Filed Vitals:   02/23/13 0711  BP: 152/76  Pulse: 110  Temp: 98.6 F (37 C)  Resp: 20   Filed Vitals:   02/22/13 1300 02/22/13 2217 02/22/13 2224 02/23/13 0711  BP: 163/68 188/68 180/70 152/76  Pulse: 121 106  110  Temp: 98.9 F (37.2 C) 98.8 F (37.1 C)  98.6 F (37 C)  TempSrc: Oral Oral  Oral  Resp: 18 20  20   Height:      Weight:      SpO2: 94% 100%  100%    General: Pt is alert, follows commands appropriately, not in acute distress Cardiovascular: Regular rhythm, tachycardic, S1/S2 +, no murmurs, no rubs, no gallops Respiratory: Clear to auscultation bilaterally, no wheezing, crackles at bases Abdominal: Soft, non tender, non distended, bowel sounds +, no guarding Extremities: no edema, no cyanosis, pulses palpable bilaterally DP and PT Neuro: Grossly nonfocal  Discharge Instructions  Discharge Orders   Future Appointments Provider Department Dept Phone   03/01/2013 2:00 PM Almond Lint, MD [facility], [place] [phone]   03/03/2013 2:45 PM Krista [facility] [phone]   03/03/2013 3:15 PM Augustin Schooling, NP [place] [facility] [phone]   03/24/2013 2:45 PM Dava Najjar [facility] [phone]   03/24/2013 3:15 PM Augustin Schooling, NP [place] [facility] [phone]   Future Orders Complete By Expires     Diet - low sodium heart healthy  As directed     Increase activity slowly  As directed         Medication List    TAKE these medications        albuterol 108 (90 BASE) MCG/ACT inhaler  Commonly known as:  PROVENTIL HFA;VENTOLIN HFA  Inhale 2 puffs into the lungs every 6 (six) hours as needed for wheezing.     amLODipine 5 MG tablet  Commonly known as:  NORVASC  Take 5 mg by mouth daily.     anastrozole 1 MG tablet  Commonly known as:  ARIMIDEX  Take 1 tablet (1 mg total) by mouth daily.     bisacodyl 10 MG suppository  Commonly known as:  DULCOLAX  Place 1 suppository (10 mg total) rectally daily as needed.     cyclobenzaprine 10 MG tablet  Commonly known as:  FLEXERIL  Take 10 mg by mouth 2 (two) times daily as needed for muscle spasms.     glipiZIDE 10 MG tablet  Commonly known as:  GLUCOTROL  Take 10-20 mg by mouth 2 (two) times daily before a meal. Takes 2 in am and 1 in evening     HYDROcodone-acetaminophen 5-325 MG per tablet  Commonly known as:  NORCO/VICODIN  Take 1 tablet by mouth every 4 (four) hours as needed.     INVEGA SUSTENNA IM  Inject into the muscle every 30 (thirty) days. Injections once monthly at [facility] last given on 02/01/2013.     LORazepam 0.5 MG tablet  Commonly known as:  ATIVAN  Take 1 tablet (0.5 mg total) by mouth every 6 (six) hours as needed for anxiety.     pantoprazole 40 MG tablet  Commonly known as:  PROTONIX  Take 40 mg by mouth daily.     predniSONE 20 MG tablet  Commonly known as:  DELTASONE  Take 40 mg tablet today, taper down by 10 mg daily until completed     simvastatin 20 MG tablet  Commonly known as:  ZOCOR  Take 20 mg by mouth every evening.     traMADol 50 MG tablet  Commonly known as:  ULTRAM  Take 1 tablet (50 mg total) by mouth every 6 (six) hours as needed for pain.           Follow-up Information   Follow up with [facility], [place] In 2 weeks.   Contact information:   [address] [place] [address] [phone]        The results of significant diagnostics from this  hospitalization (including imaging, microbiology, ancillary and laboratory) are listed below for reference.     Microbiology: No results found for this or any previous visit (from the past 240 hour(s)).   Labs: Basic Metabolic Panel:  Recent Labs Lab 02/18/13 0430 02/19/13 0515 02/20/13 0513 02/21/13 0436 02/23/13 0535  NA 134* 135 135 134* 131*  K 4.3 4.4 4.8 4.6 4.7  CL 98 99 97 97 95*  CO2 26 24 26 27 25   GLUCOSE 213* 119* 131* 141* 147*  BUN 30* 33* 31* 32* 37*  CREATININE 1.53* 1.48* 1.15* 1.19* 1.17*  CALCIUM 9.3 9.4 9.7 9.3 9.6  MG  --   --   --   --  1.6   Liver Function Tests:  Recent Labs Lab 02/17/13 0935 02/18/13 0430  AST 22 16  ALT 7 7  ALKPHOS 80 80  BILITOT 0.2* 0.1*  PROT 6.7 6.8  ALBUMIN 2.3* 2.5*  CBC:  Recent Labs Lab 02/17/13 0935 02/18/13 0430 02/19/13 0515  WBC 5.7 4.0 8.6  NEUTROABS 3.5  --   --   HGB 10.9* 10.3* 10.6*  HCT 32.8* 31.7* 32.1*  MCV 87.5 86.1 85.4  PLT 391 473* 452*   Cardiac Enzymes:  Recent Labs Lab 02/17/13 0935 02/20/13 0801 02/20/13 1328 02/20/13 1932  TROPONINI <0.30 <0.30 <0.30 <0.30   BNP: BNP (last 3 results)  Recent Labs  02/17/13 0935  PROBNP 4967.0*   SIGNED: Time coordinating discharge: Over 30 minutes  Debbora Presto, MD  [facility] 02/23/2013, 10:35 AM Pager [phone]  If 7PM-7AM, please contact night-coverage [email or web address] Password [email or web address]

## 2013-02-23 NOTE — Progress Notes (Signed)
Patient had a brief episode of trigeminal PVC's, patient was asymptomatic and asleep. On call K. Schorr was made aware and ordered magnesium lab for am. Will continue to monitor patient.

## 2013-02-23 NOTE — Progress Notes (Signed)
Palliative Medicine Team SW Farewell visit with pt. Pt in high spirits, hopeful for continued recovery and plans for rehab stay. Pt thankful for PMT help and support. Pt reports family coping well. No further needs.   Kennieth Francois, LCSWA PMT Phone [phone] Pager [phone]

## 2013-02-23 NOTE — Progress Notes (Addendum)
Patient is set to discharge to [facility] today. Patient & son, [name] aware. Son to transport patient to SNF around 4:00pm. Discharge packet in [place]. Patient will follow-up with Social Worker @ [employer] to assist with completing advance directives.   Clinical Social Work Department CLINICAL SOCIAL WORK PLACEMENT NOTE 02/23/2013  Patient:  [name], [name]  Account Number:  [id number] Admit date:  02/17/2013  Clinical Social Worker:  Orpah Greek  Date/time:  02/23/2013 01:34 PM  Clinical Social Work is seeking post-discharge placement for this patient at the following level of care:   SKILLED NURSING   (*CSW will update this form in Epic as items are completed)   02/22/2013  Patient/family provided with [facility] Department of Clinical Social Work's list of facilities offering this level of care within the geographic area requested by the patient (or if unable, by the patient's family).  02/22/2013  Patient/family informed of their freedom to choose among providers that offer the needed level of care, that participate in Medicare, Medicaid or managed care program needed by the patient, have an available bed and are willing to accept the patient.  02/22/2013  Patient/family informed of [facility]' ownership interest in [facility], as well as of the fact that they are under no obligation to receive care at this facility.  PASARR submitted to EDS on 02/22/2013 PASARR number received from [employer] on 02/22/2013  FL2 transmitted to all facilities in geographic area requested by pt/family on  02/22/2013 FL2 transmitted to all facilities within larger geographic area on   Patient informed that his/her managed care company has contracts with or will negotiate with  certain facilities, including the following:     Patient/family informed of bed offers received:  02/22/2013 Patient chooses bed at [facility] Physician recommends and  patient chooses bed at    Patient to be transferred to [facility] on  02/23/2013 Patient to be transferred to facility by son, [name]'s car  The following physician request were entered in Epic:   Additional Comments:  Unice Bailey, LCSW [facility] Clinical Social Worker cell #: [phone]

## 2013-02-23 NOTE — Progress Notes (Signed)
Patients HR increased from 120's to 192 with ambulation while trying to get dressed. MD was notified. Patient did not have any shortness of breath, vital signs stable, and oxygen saturation on room air was 95%. Orders were placed for metoprolol 5mg  IV to be given stat. Metoprolol was given to patient at 1700. At 1730 HR was 105 sinus tachycardia. MD was notified and was okay for patient to be discharged to facility. Setzer, Don Broach

## 2013-02-23 NOTE — Progress Notes (Signed)
Attempted to call report to [facility] care but no one answering phone and able to take call. Setzer, Don Broach

## 2013-02-28 ENCOUNTER — Encounter (INDEPENDENT_AMBULATORY_CARE_PROVIDER_SITE_OTHER): Payer: Self-pay | Admitting: General Surgery

## 2013-03-01 ENCOUNTER — Encounter (INDEPENDENT_AMBULATORY_CARE_PROVIDER_SITE_OTHER): Payer: Medicare Other | Admitting: General Surgery

## 2013-03-01 ENCOUNTER — Encounter (INDEPENDENT_AMBULATORY_CARE_PROVIDER_SITE_OTHER): Payer: Self-pay

## 2013-03-01 ENCOUNTER — Telehealth (INDEPENDENT_AMBULATORY_CARE_PROVIDER_SITE_OTHER): Payer: Self-pay | Admitting: General Surgery

## 2013-03-01 ENCOUNTER — Other Ambulatory Visit (INDEPENDENT_AMBULATORY_CARE_PROVIDER_SITE_OTHER): Payer: Self-pay | Admitting: General Surgery

## 2013-03-01 DIAGNOSIS — C50911 Malignant neoplasm of unspecified site of right female breast: Secondary | ICD-10-CM

## 2013-03-01 NOTE — Progress Notes (Signed)
Patient does not need a follow up appointment with Dr. Donell Beers.  Patient to follow up with Dr. Welton Flakes.

## 2013-03-01 NOTE — Telephone Encounter (Signed)
Spoke with son I have sent him a reminder for her pet scan skull  To thigh  On 03/11/13 9:45  At [facility]

## 2013-03-03 ENCOUNTER — Telehealth: Payer: Self-pay | Admitting: Oncology

## 2013-03-03 ENCOUNTER — Ambulatory Visit (HOSPITAL_BASED_OUTPATIENT_CLINIC_OR_DEPARTMENT_OTHER): Payer: Medicare Other | Admitting: Adult Health

## 2013-03-03 ENCOUNTER — Other Ambulatory Visit (HOSPITAL_BASED_OUTPATIENT_CLINIC_OR_DEPARTMENT_OTHER): Payer: Medicare Other | Admitting: Lab

## 2013-03-03 ENCOUNTER — Encounter: Payer: Self-pay | Admitting: Adult Health

## 2013-03-03 VITALS — BP 148/69 | HR 114 | Temp 97.9°F | Resp 20 | Ht 62.0 in | Wt 202.3 lb

## 2013-03-03 DIAGNOSIS — C50911 Malignant neoplasm of unspecified site of right female breast: Secondary | ICD-10-CM

## 2013-03-03 DIAGNOSIS — Z170 Estrogen receptor positive status [ER+]: Secondary | ICD-10-CM

## 2013-03-03 DIAGNOSIS — C50419 Malignant neoplasm of upper-outer quadrant of unspecified female breast: Secondary | ICD-10-CM

## 2013-03-03 DIAGNOSIS — C773 Secondary and unspecified malignant neoplasm of axilla and upper limb lymph nodes: Secondary | ICD-10-CM

## 2013-03-03 LAB — CBC WITH DIFFERENTIAL/PLATELET
BASO%: 1.2 % (ref 0.0–2.0)
EOS%: 0.5 % (ref 0.0–7.0)
LYMPH%: 4.6 % — ABNORMAL LOW (ref 14.0–49.7)
MCHC: 31.7 g/dL (ref 31.5–36.0)
MCV: 88.6 fL (ref 79.5–101.0)
MONO%: 5.2 % (ref 0.0–14.0)
Platelets: 316 10*3/uL (ref 145–400)
RBC: 3.85 10*6/uL (ref 3.70–5.45)
RDW: 16.3 % — ABNORMAL HIGH (ref 11.2–14.5)

## 2013-03-03 LAB — COMPREHENSIVE METABOLIC PANEL (CC13)
ALT: 22 U/L (ref 0–55)
AST: 23 U/L (ref 5–34)
Albumin: 3.1 g/dL — ABNORMAL LOW (ref 3.5–5.0)
Alkaline Phosphatase: 74 U/L (ref 40–150)
Glucose: 171 mg/dl — ABNORMAL HIGH (ref 70–99)
Potassium: 5.2 mEq/L — ABNORMAL HIGH (ref 3.5–5.1)
Sodium: 135 mEq/L — ABNORMAL LOW (ref 136–145)
Total Bilirubin: 0.37 mg/dL (ref 0.20–1.20)
Total Protein: 7.3 g/dL (ref 6.4–8.3)

## 2013-03-03 MED ORDER — EXEMESTANE 25 MG PO TABS: 25.0000 mg | ORAL_TABLET | Freq: Every day | ORAL | Status: DC

## 2013-03-03 MED ORDER — EVEROLIMUS 5 MG PO TABS: 5.0000 mg | ORAL_TABLET | Freq: Every day | ORAL | Status: DC

## 2013-03-03 NOTE — Progress Notes (Signed)
OFFICE PROGRESS NOTE  CCGeorgianne Fick, MD [address] [place] [address]  DIAGNOSIS: 79 year old female with Stage IV ER positive, PR positive, HER/2-neu negative invasive ductal carcinoma.   PRIOR THERAPY: 1. Patient is found a lump in her left breast sometime in 2010 and eventually sought medical treatment.  She was found to have stage III invasive ductal carcinoma.  She underwent left lumpectomy and axillary lymph node dissection on 05/03/09 for a pT2 pN2 invasive ductal carcinoma, grade 2, ER 100% positive, PR 99% positive, Mib-1 of 29%, and HER-2/neu negative.  She underwent left simple mastectomy on 06/04/09 to clear margins.  2.  She then underwent post-mastectomy radiation to the left chest wall.    3.  She was treated adjuvantly with Letrozole for approximately one year per the patient's recollection.    4. On 01/20/13 she had a right axillary lymph node biopsy that showed invasive ductal carcinoma, ER 100% positive, PR 27% positive, with an Mib-1 of 47%, and no HER-2 amplification.  Right breast biopsy on 01/31/09 showed invasive ductal carcinoma, grade three with similar profile to right axillary biopsy.    5. CT chest w/o contrast on 02/18/13 showed stage IV disease with extensive adenopathy including mediastinum and neck as well as apparent pleural spread on the left.  There was also extensive skin involvement.    CURRENT THERAPY:  Arimidex daily  INTERVAL HISTORY: [name] 79 y.o. female returns for follow up of her stage IV invasive ductal carcinoma.  She was recently hospitalized with pneumonia, and oncology was consulted for their opinions on hospice and palliative care.  She was started on Arimidex 1 mg daily.  She is taking this pill every day.  She was discharged to [facility] skilled nursing facility and does not have a list of her medications for review today, or an order sheet to communicate with the facility.  I did call  and review her medications with her nurse at the facility.  The family is asking about the research medication that the patient is supposed to be on, and after calls to the [facility] the research RN, the protocol that Dr Welton Flakes was looking at putting the patient on was Afinitor.  However, due to the patient being placed on Arimidex, this disqualifies her for that study.  The family was frustrated with the visit, and the delays in getting information. [name] is tolerating the Arimidex well.    MEDICAL HISTORY: Past Medical History  Diagnosis Date  . Hypertension   . Diabetes mellitus without complication   . Breast cancer     ALLERGIES:  has No Known Allergies.  MEDICATIONS:  Current Outpatient Prescriptions  Medication Sig Dispense Refill  . albuterol (PROVENTIL HFA;VENTOLIN HFA) 108 (90 BASE) MCG/ACT inhaler Inhale 2 puffs into the lungs every 6 (six) hours as needed for wheezing.      Marland Kitchen amLODipine (NORVASC) 5 MG tablet Take 5 mg by mouth daily.      . bisacodyl (DULCOLAX) 10 MG suppository Place 1 suppository (10 mg total) rectally daily as needed.  12 suppository  1  . cyclobenzaprine (FLEXERIL) 10 MG tablet Take 10 mg by mouth 2 (two) times daily as needed for muscle spasms.       Marland Kitchen glipiZIDE (GLUCOTROL) 10 MG tablet Take 10-20 mg by mouth 2 (two) times daily before a meal. Takes 2 in am and 1 in evening      . HYDROcodone-acetaminophen (NORCO/VICODIN) 5-325 MG per  tablet Take 1 tablet by mouth every 4 (four) hours as needed.  30 tablet  0  . LORazepam (ATIVAN) 0.5 MG tablet Take 1 tablet (0.5 mg total) by mouth every 6 (six) hours as needed for anxiety.  30 tablet  1  . pantoprazole (PROTONIX) 40 MG tablet Take 40 mg by mouth daily.      . predniSONE (DELTASONE) 20 MG tablet 20 mg daily. Take 40 mg tablet today, taper down by 10 mg daily until completed      . simvastatin (ZOCOR) 20 MG tablet Take 20 mg by mouth every evening.      . traMADol (ULTRAM) 50 MG tablet Take 1 tablet (50 mg  total) by mouth every 6 (six) hours as needed for pain.  60 tablet  0  . everolimus (AFINITOR) 5 MG tablet Take 1 tablet (5 mg total) by mouth daily.  30 tablet  2  . exemestane (AROMASIN) 25 MG tablet Take 1 tablet (25 mg total) by mouth daily after breakfast.  30 tablet  2  . Paliperidone Palmitate (INVEGA SUSTENNA IM) Inject into the muscle every 30 (thirty) days. Injections once monthly at [facility] last given on 02/01/2013.       No current facility-administered medications for this visit.    SURGICAL HISTORY:  Past Surgical History  Procedure Laterality Date  . Breast surgery      REVIEW OF SYSTEMS:   General: fatigue (-), night sweats (-), fever (-), pain (-) Lymph: palpable nodes (-) HEENT: vision changes (-), mucositis (-), gum bleeding (-), epistaxis (-) Cardiovascular: chest pain (-), palpitations (-) Pulmonary: shortness of breath (-), dyspnea on exertion (-), cough (-), hemoptysis (-) GI:  Early satiety (-), melena (-), dysphagia (-), nausea/vomiting (-), diarrhea (-) GU: dysuria (-), hematuria (-), incontinence (-) Musculoskeletal: joint swelling (-), joint pain (-), back pain (-) Neuro: weakness (-), numbness (-), headache (-), confusion (-) Skin: Rash (-), lesions (-), dryness (-) Psych: depression (-), suicidal/homicidal ideation (-), feeling of hopelessness (-)  PHYSICAL EXAMINATION: Blood pressure 148/69, pulse 114, temperature 97.9 F (36.6 C), temperature source Oral, resp. rate 20, height 5\' 2"  (1.575 m), weight 202 lb 4.8 oz (91.763 kg). Body mass index is 36.99 kg/(m^2). General: Patient is a well appearing female in no acute distress HEENT: PERRLA, sclerae anicteric no conjunctival pallor, MMM Neck: supple, no palpable adenopathy Lungs: clear to auscultation bilaterally, no wheezes, rhonchi, or rales Cardiovascular: regular rate rhythm, S1, S2, no murmurs, rubs or gallops Abdomen: Soft, non-tender, non-distended,  normoactive bowel sounds, no HSM Extremities: warm and well perfused, no clubbing, cyanosis, or edema Skin: No rashes or lesions Neuro: Non-focal Breast exam was declined today ECOG PERFORMANCE STATUS: 2 - Symptomatic, <50% confined to bed   LABORATORY DATA: Lab Results  Component Value Date   WBC 10.8* 03/03/2013   HGB 10.8* 03/03/2013   HCT 34.1* 03/03/2013   MCV 88.6 03/03/2013   PLT 316 03/03/2013      Chemistry      Component Value Date/Time   NA 135* 03/03/2013 1407   NA 131* 02/23/2013 0535   K 5.2* 03/03/2013 1407   K 4.7 02/23/2013 0535   CL 102 03/03/2013 1407   CL 95* 02/23/2013 0535   CO2 21* 03/03/2013 1407   CO2 25 02/23/2013 0535   BUN 30.7* 03/03/2013 1407   BUN 37* 02/23/2013 0535   CREATININE 1.2* 03/03/2013 1407   CREATININE 1.17* 02/23/2013 0535   CREATININE 1.41* 01/27/2013 1517  Component Value Date/Time   CALCIUM 9.4 03/03/2013 1407   CALCIUM 9.6 02/23/2013 0535   ALKPHOS 74 03/03/2013 1407   ALKPHOS 80 02/18/2013 0430   AST 23 03/03/2013 1407   AST 16 02/18/2013 0430   ALT 22 03/03/2013 1407   ALT 7 02/18/2013 0430   BILITOT 0.37 03/03/2013 1407   BILITOT 0.1* 02/18/2013 0430       RADIOGRAPHIC STUDIES:  Dg Chest 2 View  02/17/2013  *RADIOLOGY REPORT*  Clinical Data: Cough and shortness of breath.  Hypertension and diabetes.  CHEST - 2 VIEW  Comparison: CT 04/01/2011 and plain film 11/20/2009  Findings: Surgical clips within the left axilla.  Patient rotated left. Cardiomegaly accentuated by AP portable technique.  Small to moderate left-sided pleural effusion. No pneumothorax.  Low lung volumes with resultant pulmonary interstitial prominence.  Left sided airspace disease which is new.  IMPRESSION: Left-sided pleural effusion and adjacent infection or atelectasis. Recommend radiographic follow-up until clearing.  Cardiomegaly with low lung volumes on the frontal.  Interstitial prominence is favored to be secondary.  Concurrent mild pulmonary venous congestion is  difficult to exclude.   Original Report Authenticated By: Jeronimo Greaves, M.D.    Ct Chest Wo Contrast  02/18/2013  *RADIOLOGY REPORT*  Clinical Data: Shortness of breath.  Pleural effusion.  History of left-sided breast cancers status post left mastectomy, chemotherapy and radiation therapy now complete.  CT CHEST WITHOUT CONTRAST  Technique:  Multidetector CT imaging of the chest was performed following the standard protocol without IV contrast.  Comparison: Chest CT 04/01/2011.  Findings:  Mediastinum: Extensive anterior mediastinal lymphadenopathy is now noted, with the largest lymph nodes measuring up to 1.4-1.7 cm in short axis. Enlarged lymph nodes are also noted the superior mediastinum immediately anterior to the origin of the left common carotid artery measuring up to 1.5 cm in short axis.  Heart size is normal. There is no significant pericardial fluid, thickening or pericardial calcification. There is atherosclerosis of the thoracic aorta, the great vessels of the mediastinum and the coronary arteries, including calcified atherosclerotic plaque in the left main, left anterior descending and right coronary arteries. Esophagus is unremarkable in appearance.  Lungs/Pleura: Although difficult to assess for certain on the noncontrast CT examination, there appears to be multifocal pleural- based nodularity in the left hemithorax, best demonstrated on image 39 of series 2 where there is an apparent 2.6 x 1.4 cm pleural based nodule in the medial aspect of the left hemithorax.  There is a new moderate left sided pleural effusion which is likely malignant.  Extensive passive atelectasis in the left lower lobe which obscures assessment of the lung parenchyma for underlying pulmonary nodules.  Subpleural reticulation in the anterolateral aspect of the left upper lobe is likely related to a prior left- sided breast radiation therapy.  No definite suspicious appearing pulmonary nodules or masses are noted within the  aerated portions of the lungs, although there are a few scattered 1-2 mm pulmonary nodules which are highly nonspecific.  Upper Abdomen: Soft tissue in the upper retroperitoneum immediately anterolateral to the aorta (image 59 of series 2) measuring 3.0 x 2.1 cm, suspicious for lymphadenopathy.  Additional smaller soft tissue lesions are also noted throughout the visualized upper abdomen.  Extensive edema is noted throughout the left upper abdominal wall anteriorly and laterally.  Musculoskeletal: Status post left modified radical mastectomy and axillary nodal dissection.  There is extensive skin thickening throughout the right breast, and edema throughout the right breast as well as  the chest wall bilaterally.  In the right axilla there are numerous enlarged lymph nodes, including the largest node which is 4.7 x 3.8 cm.  Extensive subpectoral lymphadenopathy is also noted.  There are no aggressive appearing lytic or blastic lesions noted in the visualized portions of the skeleton.  IMPRESSION: 1.  Findings, as above, highly concerning for possible inflammatory breast cancer in the right breast, with extensive right axillary, subpectoral, mediastinal and retroperitoneal lymphadenopathy. There is also likely a malignant left pleural effusion. 2. Atherosclerosis, including left main and two-vessel coronary artery disease. 3.  Additional incidental findings, as above.  These results were called by telephone on 02/18/2013 at 03:20 p.m. to Dr. Elvera Lennox, who verbally acknowledged these results.   Original Report Authenticated By: Trudie Reed, M.D.    Dg Chest Port 1 View  02/20/2013  *RADIOLOGY REPORT*  Clinical Data: Follow up left lower lobe atelectasis and/or pneumonia and left pleural effusion.  PORTABLE CHEST - 1 VIEW 02/20/2013 0819 hours:  Comparison: Portable chest x-ray and CT chest yesterday.  Two-view chest x-ray 02/17/2013.  Findings: Persistent dense consolidation in the left lower lobe and  moderate-sized left pleural effusion, unchanged.  Cardiac silhouette normal in size, unchanged.  Mild pulmonary venous hypertension without overt edema.  Prior left axillary node dissection.  IMPRESSION: Stable dense left lower lobe atelectasis or pneumonia and moderate- sized left pleural effusion.  Developing pulmonary venous hypertension without overt edema, query incipient fluid overload.   Original Report Authenticated By: Hulan Saas, M.D.    Portable Chest 1 View  02/18/2013  *RADIOLOGY REPORT*  Clinical Data: Pleural effusion.  PORTABLE CHEST - 1 VIEW  Comparison: 02/17/2013.  Findings: Trachea is midline.  Heart size grossly stable. Bihilar prominence.  Left basilar collapse/consolidation and left pleural effusion appear unchanged. Postoperative changes of left mastectomy.  Surgical clips in the left axilla.  IMPRESSION:  Left lower lobe collapse/consolidation and left pleural effusion with left hilar prominence.  A centrally obstructing mass with postobstructive pneumonitis/pneumonia could have this appearance. CT chest with contrast would be helpful in further evaluation, as clinically indicated.   Original Report Authenticated By: Leanna Battles, M.D.     ASSESSMENT:  Ms. Negrette is a 79 year old female with stage IV ER positive PR positive, HER-2/neu negative invasive ductal carcinoma.  Her last CT scans were 02/18/13.     PLAN:  1. After reviewing this case with Dr. Welton Flakes, Ms. Karren will stop the Arimidex daily, and start Aromasin daily and Afinitor 5mg  daily.  Since we did not get any communication sheet from [facility], I did call the nurse and inform her of this, and faxed orders. I will follow up with them to make sure they were able to get the medications.    2.  We will see Ms. Hauth back in one month.    All questions were answered. The patient knows to call the clinic with any problems, questions or concerns. We can certainly see the patient much sooner if  necessary.  I spent 25 minutes counseling the patient face to face. The total time spent in the appointment was 30 minutes.  Cherie Ouch Lyn Hollingshead, NP Medical Oncology [facility] Phone: [phone] 03/05/2013, 9:39 AM

## 2013-03-03 NOTE — Patient Instructions (Addendum)
Doing well.  We are going to start you on a different combination of breast cancer medications.   1. Aromasin  2. Afinitor.   Do not take the Arimidex any longer.  Everolimus tablets What is this medicine? EVEROLIMUS (eve ROE li mus) decreases the activity of your immune system. Afinitor is used to treat certain types of cancer. Zortress is used for kidney and liver transplant rejection prophylaxis. This medicine may be used for other purposes; ask your health care provider or pharmacist if you have questions. What should I tell my health care provider before I take this medicine? They need to know if you have any of these conditions: -diabetes -heart disease -high cholesterol -immune system problems -infection (especially a virus infection such as chickenpox, cold sores, or herpes) -kidney disease -liver disease -low blood counts, like low white cell, platelet, or red cell counts -rare hereditary problems of galactose intolerance, the Lapp lactase deficiency, or glucose-galactose malabsorption -an unusual or allergic reaction to everolimus, other medicines, foods, dyes, or preservatives -pregnant or trying to get pregnant -breast-feeding How should I use this medicine? Take this medicine by mouth with a full glass of water. Follow the directions on the prescription label. You can take this medicine with or without food, but always take Zortress the same way. Do not cut, crush, or chew this medicine. Do not take with grapefruit juice. Take your medicine at regular intervals. Do not take it more often than directed. Do not stop taking except on your doctor's advice. Talk to your pediatrician regarding the use of this medicine in children. Special care may be needed. Overdosage: If you think you've taken too much of this medicine contact a poison control center or emergency room at once. Overdosage: If you think you have taken too much of this medicine contact a poison control center or  emergency room at once. NOTE: This medicine is only for you. Do not share this medicine with others. What if I miss a dose? If you miss a dose, take it as soon as you can. If it is almost time for your next dose, take only that dose. Do not take double or extra doses. What may interact with this medicine? This medicine may interact with the following medications: -antiviral medicines for HIV or AIDS -aprepitant -carbamazepine -certain medicines for cholesterol like simvastatin -clarithromycin -cyclosporine -dexamethasone -diltiazem -erythromycin -fluconazole -grapefruit juice -itraconazole -ketoconazole -live vaccines -nefazodone -nicardipine -phenobarbital -phenytoin -rifabutin -rifampin -telithromycin -verapamil -voriconazole This list may not describe all possible interactions. Give your health care provider a list of all the medicines, herbs, non-prescription drugs, or dietary supplements you use. Also tell them if you smoke, drink alcohol, or use illegal drugs. Some items may interact with your medicine. What should I watch for while using this medicine? Visit your doctor or health care professional for regular check-ups. You may need regular tests to monitor possible side effects of the drug. This medicine may affect blood sugar levels. If you have diabetes, check with your doctor or health care professional before you change your diet or the dose of your diabetic medicine. Women should inform their doctor if they wish to become pregnant or think they might be pregnant. There is a potential for serious side effects to an unborn child. Talk to your health care professional or pharmacist for more information. Do not breast-feed an infant while taking this medicine. Afinitor may make you feel generally unwell. This is not uncommon, as chemotherapy can affect healthy cells as well as  cancer cells. Report any side effects. Continue your course of treatment even though you feel ill  unless your doctor tells you to stop. Call your doctor or health care professional for advice if you get a fever, chills or sore throat, or other symptoms of a cold or flu. Do not treat yourself. This drug decreases your body's ability to fight infections. Try to avoid being around people who are sick. This medicine may increase your risk to bruise or bleed. Call your doctor or health care professional if you notice any unusual bleeding. Be careful brushing and flossing your teeth or using a toothpick because you may get an infection or bleed more easily. If you have any dental work done, tell your dentist you are receiving this medicine. Avoid taking products that contain aspirin, acetaminophen, ibuprofen, naproxen, or ketoprofen unless instructed by your doctor. These medicines may hide a fever. If you have had a kidney transplant, immediately tell your doctor if your incision site is red, warm, or painful. Also, tell your doctor if your incision site opens up or swells or if contains blood, fluid, or pus. Keep out of the sun. If you cannot avoid being in the sun, wear protective clothing and use sunscreen. Do not use sun lamps or tanning beds/booths. What side effects may I notice from receiving this medicine? Side effects that you should report to your doctor or health care professional as soon as possible: -allergic reactions like skin rash, itching or hives, swelling of the face, lips, or tongue -breathing problems -chest pain -cough -dark urine -fever or chills, sore throat -increased hunger or thirst -increased urination -nausea, vomiting -stomach pain -swelling of ankles, feet, hands -trouble passing urine or change in the amount of urine -unusual bleeding or bruising -unusually weak or tired  Side effects that usually do not require medical attention (Report these to your doctor or health care professional if they continue or are  bothersome.): -constipation -diarrhea -dizziness -dry mouth or mouth sores -headache -nausea, vomiting This list may not describe all possible side effects. Call your doctor for medical advice about side effects. You may report side effects to FDA at 1-800-FDA-1088. Where should I keep my medicine? Keep out of the reach of children. Store at room temperature between 15 and 30 degrees C (59 and 86 degrees F). Throw away any unused medicine after the expiration date. NOTE: This sheet is a summary. It may not cover all possible information. If you have questions about this medicine, talk to your doctor, pharmacist, or health care provider.  2013, [employer]vier/Gold Standard. (12/30/2011 12:04:14 PM) Exemestane tablets What is this medicine? EXEMESTANE (ex e MES tane) blocks the production of the hormone estrogen. Some types of breast cancer depend on estrogen to grow, and this medicine can stop tumor growth by blocking estrogen production. This medicine is for the treatment of breast cancer in postmenopausal women only. This medicine may be used for other purposes; ask your health care provider or pharmacist if you have questions. What should I tell my health care provider before I take this medicine? They need to know if you have any of these conditions: -an unusual or allergic reaction to exemestane, other medicines, foods, dyes, or preservatives -pregnant or trying to get pregnant -breast-feeding How should I use this medicine? Take this medicine by mouth with a glass of water. Follow the directions on the prescription label. Take your doses at regular intervals after a meal. Do not take your medicine more often than directed. Do  not stop taking except on the advice of your doctor or health care professional. Contact your pediatrician regarding the use of this medicine in children. Special care may be needed. Overdosage: If you think you have taken too much of this medicine contact a poison  control center or emergency room at once. NOTE: This medicine is only for you. Do not share this medicine with others. What if I miss a dose? If you miss a dose, take the next dose as usual. Do not try to make up the missed dose. Do not take double or extra doses. What may interact with this medicine? Do not take this medicine with any of the following medications: -female hormones, like estrogens and birth control pills This medicine may also interact with the following medications: -androstenedione -phenytoin -rifabutin, rifampin, or rifapentine -St. John's Wort This list may not describe all possible interactions. Give your health care provider a list of all the medicines, herbs, non-prescription drugs, or dietary supplements you use. Also tell them if you smoke, drink alcohol, or use illegal drugs. Some items may interact with your medicine. What should I watch for while using this medicine? Visit your doctor or health care professional for regular checks on your progress. If you experience hot flashes or sweating while taking this medicine, avoid alcohol, smoking and drinks with caffeine. This may help to decrease these side effects. What side effects may I notice from receiving this medicine? Side effects that you should report to your doctor or health care professional as soon as possible: -any new or unusual symptoms -changes in vision -fever -leg or arm swelling -pain in bones, joints, or muscles -pain in hips, back, ribs, arms, shoulders, or legs Side effects that usually do not require medical attention (report to your doctor or health care professional if they continue or are bothersome): -difficulty sleeping -headache -hot flashes -sweating -unusually weak or tired This list may not describe all possible side effects. Call your doctor for medical advice about side effects. You may report side effects to FDA at 1-800-FDA-1088. Where should I keep my medicine? Keep out of  the reach of children. Store at room temperature between 15 and 30 degrees C (59 and 86 degrees F). Throw away any unused medicine after the expiration date. NOTE: This sheet is a summary. It may not cover all possible information. If you have questions about this medicine, talk to your doctor, pharmacist, or health care provider.  2013, Elsevier/Gold Standard. (02/29/2008 11:48:29 AM)

## 2013-03-08 ENCOUNTER — Encounter (HOSPITAL_COMMUNITY)
Admission: RE | Admit: 2013-03-08 | Discharge: 2013-03-08 | Disposition: A | Discharge status: Home / Self Care | Payer: Medicare Other | Source: Ambulatory Visit | Attending: Oncology | Admitting: Oncology

## 2013-03-08 ENCOUNTER — Telehealth (HOSPITAL_COMMUNITY): Payer: Self-pay | Admitting: Radiology

## 2013-03-08 ENCOUNTER — Encounter: Payer: Self-pay | Admitting: *Deleted

## 2013-03-08 ENCOUNTER — Other Ambulatory Visit: Payer: Self-pay | Admitting: Oncology

## 2013-03-08 ENCOUNTER — Ambulatory Visit (HOSPITAL_COMMUNITY)
Admission: RE | Admit: 2013-03-08 | Discharge: 2013-03-08 | Disposition: A | Discharge status: Home / Self Care | Payer: Medicare Other | Source: Ambulatory Visit | Attending: Oncology | Admitting: Oncology

## 2013-03-08 ENCOUNTER — Inpatient Hospital Stay (HOSPITAL_COMMUNITY): Admission: RE | Admit: 2013-03-08 | Payer: Medicare Other | Source: Ambulatory Visit

## 2013-03-08 DIAGNOSIS — E119 Type 2 diabetes mellitus without complications: Secondary | ICD-10-CM | POA: Insufficient documentation

## 2013-03-08 DIAGNOSIS — C50919 Malignant neoplasm of unspecified site of unspecified female breast: Secondary | ICD-10-CM | POA: Insufficient documentation

## 2013-03-08 DIAGNOSIS — I890 Lymphedema, not elsewhere classified: Secondary | ICD-10-CM | POA: Insufficient documentation

## 2013-03-08 DIAGNOSIS — C50911 Malignant neoplasm of unspecified site of right female breast: Secondary | ICD-10-CM

## 2013-03-08 DIAGNOSIS — C50411 Malignant neoplasm of upper-outer quadrant of right female breast: Secondary | ICD-10-CM

## 2013-03-08 DIAGNOSIS — I10 Essential (primary) hypertension: Secondary | ICD-10-CM | POA: Insufficient documentation

## 2013-03-08 DIAGNOSIS — J90 Pleural effusion, not elsewhere classified: Secondary | ICD-10-CM | POA: Insufficient documentation

## 2013-03-08 DIAGNOSIS — R209 Unspecified disturbances of skin sensation: Secondary | ICD-10-CM | POA: Insufficient documentation

## 2013-03-08 MED ORDER — IOHEXOL 300 MG/ML  SOLN
100.0000 mL | Freq: Once | INTRAMUSCULAR | Status: AC | PRN
  Administered 2013-03-08: 100 mL via INTRAVENOUS

## 2013-03-08 MED ORDER — TECHNETIUM TC 99M MEDRONATE IV KIT
25.0000 | PACK | Freq: Once | INTRAVENOUS | Status: AC | PRN
  Administered 2013-03-08: 25 via INTRAVENOUS

## 2013-03-08 NOTE — Progress Notes (Signed)
Follow up of (R)UE IV infiltration Soft tissue focal edema has remains soft, NT, no erythema. Neuro vascular intact as well.  Pt will remain in department until NM bone scan and is stable for discharge after bone scan.  Carol El PA-C

## 2013-03-08 NOTE — Progress Notes (Signed)
Mailed after appt letter to pt. 

## 2013-03-08 NOTE — Progress Notes (Signed)
Patient ID: [name], female   DOB: [birth year], 79 y.o.   MRN: [id number]  Alerted by CT technologist that there was an infiltration during CT scan.  Right antecubital soft tissue swelling after injection of 99cc of Omnipaque 300.  Only small amount of contrast on CT.   Right antecubital swelling.  Good capillary refill.  Good distal pulses.  Patient without complaints.    Right arm elevated and ice placed after infiltration site marked.    Will monitor in radiology department.

## 2013-03-10 ENCOUNTER — Encounter (HOSPITAL_COMMUNITY)
Admission: RE | Admit: 2013-03-10 | Discharge: 2013-03-10 | Disposition: A | Discharge status: Home / Self Care | Payer: Medicare Other | Source: Ambulatory Visit | Attending: General Surgery | Admitting: General Surgery

## 2013-03-10 ENCOUNTER — Ambulatory Visit: Payer: Medicare Other | Admitting: Oncology

## 2013-03-10 ENCOUNTER — Other Ambulatory Visit: Payer: Medicare Other | Admitting: Lab

## 2013-03-10 ENCOUNTER — Encounter (HOSPITAL_COMMUNITY): Payer: Self-pay

## 2013-03-10 DIAGNOSIS — R609 Edema, unspecified: Secondary | ICD-10-CM | POA: Insufficient documentation

## 2013-03-10 DIAGNOSIS — J90 Pleural effusion, not elsewhere classified: Secondary | ICD-10-CM | POA: Insufficient documentation

## 2013-03-10 DIAGNOSIS — C50911 Malignant neoplasm of unspecified site of right female breast: Secondary | ICD-10-CM

## 2013-03-10 DIAGNOSIS — C778 Secondary and unspecified malignant neoplasm of lymph nodes of multiple regions: Secondary | ICD-10-CM | POA: Insufficient documentation

## 2013-03-10 DIAGNOSIS — C50919 Malignant neoplasm of unspecified site of unspecified female breast: Secondary | ICD-10-CM | POA: Insufficient documentation

## 2013-03-10 MED ORDER — FLUDEOXYGLUCOSE F - 18 (FDG) INJECTION
18.1000 | Freq: Once | INTRAVENOUS | Status: AC | PRN
  Administered 2013-03-10: 18.1 via INTRAVENOUS

## 2013-03-10 NOTE — Consult Note (Signed)
Agree with above 

## 2013-03-10 NOTE — Progress Notes (Signed)
Follow up call to pt RN at Nursing Facility. States there is no residual swelling, redness, or skin changes at site of IV infiltration. The pt has not voiced any complaint to staff there. Advised facility to contact [facility] Radiology Dept if any further concerns.  Barbee Shropshire [facility] 03/10/2013 2:12 PM

## 2013-03-11 ENCOUNTER — Encounter (HOSPITAL_COMMUNITY): Payer: Medicare Other

## 2013-03-23 ENCOUNTER — Other Ambulatory Visit: Payer: Self-pay | Admitting: Emergency Medicine

## 2013-03-23 DIAGNOSIS — C50419 Malignant neoplasm of upper-outer quadrant of unspecified female breast: Secondary | ICD-10-CM

## 2013-03-24 ENCOUNTER — Encounter (INDEPENDENT_AMBULATORY_CARE_PROVIDER_SITE_OTHER): Payer: Self-pay | Admitting: General Surgery

## 2013-03-24 ENCOUNTER — Other Ambulatory Visit: Payer: Medicare Other | Admitting: Lab

## 2013-03-24 ENCOUNTER — Telehealth: Payer: Self-pay | Admitting: Medical Oncology

## 2013-03-24 ENCOUNTER — Telehealth: Payer: Self-pay | Admitting: Oncology

## 2013-03-24 ENCOUNTER — Ambulatory Visit: Payer: Medicare Other | Admitting: Adult Health

## 2013-03-24 NOTE — Telephone Encounter (Signed)
Patient's son called to cancel today's appt with NP d/t pt just being discharged from nursing home and being tired. MD/NP informed. Per MD, pt to be r/s. Onc tx sent.

## 2013-03-29 ENCOUNTER — Telehealth: Payer: Self-pay | Admitting: *Deleted

## 2013-03-29 ENCOUNTER — Ambulatory Visit (HOSPITAL_BASED_OUTPATIENT_CLINIC_OR_DEPARTMENT_OTHER): Payer: Medicare Other | Admitting: Adult Health

## 2013-03-29 ENCOUNTER — Encounter: Payer: Self-pay | Admitting: Adult Health

## 2013-03-29 ENCOUNTER — Telehealth: Payer: Self-pay

## 2013-03-29 ENCOUNTER — Other Ambulatory Visit (HOSPITAL_BASED_OUTPATIENT_CLINIC_OR_DEPARTMENT_OTHER): Payer: Medicare Other | Admitting: Lab

## 2013-03-29 VITALS — BP 112/66 | HR 73 | Temp 98.6°F | Resp 20 | Ht 62.0 in | Wt 207.0 lb

## 2013-03-29 DIAGNOSIS — C50411 Malignant neoplasm of upper-outer quadrant of right female breast: Secondary | ICD-10-CM

## 2013-03-29 DIAGNOSIS — I890 Lymphedema, not elsewhere classified: Secondary | ICD-10-CM

## 2013-03-29 DIAGNOSIS — C50419 Malignant neoplasm of upper-outer quadrant of unspecified female breast: Secondary | ICD-10-CM

## 2013-03-29 DIAGNOSIS — C50911 Malignant neoplasm of unspecified site of right female breast: Secondary | ICD-10-CM

## 2013-03-29 DIAGNOSIS — Z170 Estrogen receptor positive status [ER+]: Secondary | ICD-10-CM

## 2013-03-29 LAB — CBC WITH DIFFERENTIAL/PLATELET
BASO%: 0.6 % (ref 0.0–2.0)
LYMPH%: 7.3 % — ABNORMAL LOW (ref 14.0–49.7)
MCHC: 32.3 g/dL (ref 31.5–36.0)
MCV: 89 fL (ref 79.5–101.0)
MONO%: 10.4 % (ref 0.0–14.0)
Platelets: 273 10*3/uL (ref 145–400)
RBC: 3.84 10*6/uL (ref 3.70–5.45)
RDW: 16.5 % — ABNORMAL HIGH (ref 11.2–14.5)
WBC: 5.8 10*3/uL (ref 3.9–10.3)

## 2013-03-29 LAB — COMPREHENSIVE METABOLIC PANEL (CC13)
ALT: 15 U/L (ref 0–55)
AST: 19 U/L (ref 5–34)
Alkaline Phosphatase: 70 U/L (ref 40–150)
Sodium: 139 mEq/L (ref 136–145)
Total Bilirubin: 0.27 mg/dL (ref 0.20–1.20)
Total Protein: 6.7 g/dL (ref 6.4–8.3)

## 2013-03-29 MED ORDER — DENOSUMAB 120 MG/1.7ML ~~LOC~~ SOLN: 120.0000 mg | Freq: Once | SUBCUTANEOUS | Status: DC

## 2013-03-29 NOTE — Telephone Encounter (Signed)
sw pt son gave appt d/t for the lymphedema clinic on 04/06/13 @9 :45am...td

## 2013-03-29 NOTE — Patient Instructions (Addendum)
Continue taking Aromasin 25 mg daily and Afinitor 5mg  daily.  We will see you back in 4 weeks.     Denosumab injection What is this medicine? DENOSUMAB slows bone breakdown. It is used to treat osteoporosis in women after menopause and in men. This medicine is also used to prevent bone fractures and other bone problems caused by cancer bone metastases. This medicine may be used for other purposes; ask your health care provider or pharmacist if you have questions. What should I tell my health care provider before I take this medicine? They need to know if you have any of these conditions: -dental disease -eczema -infection or history of infections -kidney disease or on dialysis -low blood calcium or vitamin D -malabsorption syndrome -scheduled to have surgery or tooth extraction -taking medicine that contains denosumab -thyroid or parathyroid disease -an unusual reaction to denosumab, other medicines, foods, dyes, or preservatives -pregnant or trying to get pregnant -breast-feeding How should I use this medicine? This medicine is for injection under the skin. It is given by a health care professional in a hospital or clinic setting. If you are getting Prolia, a special MedGuide will be given to you by the pharmacist with each prescription and refill. Be sure to read this information carefully each time. Talk to your pediatrician regarding the use of this medicine in children. Special care may be needed. Overdosage: If you think you've taken too much of this medicine contact a poison control center or emergency room at once. Overdosage: If you think you have taken too much of this medicine contact a poison control center or emergency room at once. NOTE: This medicine is only for you. Do not share this medicine with others. What if I miss a dose? It is important not to miss your dose. Call your doctor or health care professional if you are unable to keep an appointment. What may interact  with this medicine? Do not take this medicine with any of the following medications: -other medicines containing denosumab This medicine may also interact with the following medications: -medicines that suppress the immune system -medicines that treat cancer -steroid medicines like prednisone or cortisone This list may not describe all possible interactions. Give your health care provider a list of all the medicines, herbs, non-prescription drugs, or dietary supplements you use. Also tell them if you smoke, drink alcohol, or use illegal drugs. Some items may interact with your medicine. What should I watch for while using this medicine? Visit your doctor or health care professional for regular checks on your progress. Your doctor or health care professional may order blood tests and other tests to see how you are doing. Call your doctor or health care professional if you get a cold or other infection while receiving this medicine. Do not treat yourself. This medicine may decrease your body's ability to fight infection. You should make sure you get enough calcium and vitamin D while you are taking this medicine, unless your doctor tells you not to. Discuss the foods you eat and the vitamins you take with your health care professional. See your dentist regularly. Brush and floss your teeth as directed. Before you have any dental work done, tell your dentist you are receiving this medicine. What side effects may I notice from receiving this medicine? Side effects that you should report to your doctor or health care professional as soon as possible: -allergic reactions like skin rash, itching or hives, swelling of the face, lips, or tongue -breathing problems -chest  pain -fast, irregular heartbeat -feeling faint or lightheaded, falls -fever, chills, or any other sign of infection -muscle spasms, tightening, or twitches -numbness or tingling -skin blisters or bumps, or is dry, peels, or red -slow  healing or unexplained pain in the mouth or jaw -unusual bleeding or bruising Side effects that usually do not require medical attention (Report these to your doctor or health care professional if they continue or are bothersome.): -muscle pain -stomach upset, gas This list may not describe all possible side effects. Call your doctor for medical advice about side effects. You may report side effects to FDA at 1-800-FDA-1088. Where should I keep my medicine? This medicine is only given in a clinic, doctor's office, or other health care setting and will not be stored at home. NOTE: This sheet is a summary. It may not cover all possible information. If you have questions about this medicine, talk to your doctor, pharmacist, or health care provider.  2013, [employer]. (08/05/2011 3:40:41 PM)

## 2013-03-29 NOTE — Telephone Encounter (Signed)
appt made and printed. Pt is aware that i will schedule her appt for the lymphedema after they re open from lunch...td

## 2013-03-29 NOTE — Telephone Encounter (Signed)
Left message with Medical Records dept at [facility] requesting them to fax a copy of pts most recent MAR. TMB

## 2013-03-29 NOTE — Progress Notes (Signed)
OFFICE PROGRESS NOTE  CCGeorgianne Fick, MD [address] [place] [address]  DIAGNOSIS: 79 year old female with Stage IV ER positive, PR positive, HER/2-neu negative invasive ductal carcinoma.   PRIOR THERAPY: 1. Patient is found a lump in her left breast sometime in 2010 and eventually sought medical treatment.  She was found to have stage III invasive ductal carcinoma.  She underwent left lumpectomy and axillary lymph node dissection on 05/03/09 for a pT2 pN2 invasive ductal carcinoma, grade 2, ER 100% positive, PR 99% positive, Mib-1 of 29%, and HER-2/neu negative.  She underwent left simple mastectomy on 06/04/09 to clear margins.  2.  She then underwent post-mastectomy radiation to the left chest wall.    3.  She was treated adjuvantly with Letrozole for approximately one year per the patient's recollection.    4. On 01/20/13 she had a right axillary lymph node biopsy that showed invasive ductal carcinoma, ER 100% positive, PR 27% positive, with an Mib-1 of 47%, and no HER-2 amplification.  Right breast biopsy on 01/31/09 showed invasive ductal carcinoma, grade three with similar profile to right axillary biopsy.    5. CT chest w/o contrast on 02/18/13 showed stage IV disease with extensive adenopathy including mediastinum and neck as well as apparent pleural spread on the left.  There was also extensive skin involvement.    6. Patient started on Arimidex in early April, 2014, however this was discontinued on 03/03/13 and Aromasin and Afinitor were started daily.   CURRENT THERAPY:  Aromasin 25mg  daily/Afinitor 5mg  daily  INTERVAL HISTORY: [name] 79 y.o. female returns for follow up of her stage IV invasive ductal carcinoma.  She was recently hospitalized with pneumonia discharged to [facility] skilled nursing facility.  She was discharged from the SNF last week.  She is now at home living with her husband and son.  She is doing well.  She  continues to have swelling in her left arm and is having difficulty getting her clothes on.  She remains weak, and has difficulty with steps.  She did not bring any assistive devices to the appointment today, and was completely unable to get up to the exam table.  Otherwise, a 10 point ROS is neg.   MEDICAL HISTORY: Past Medical History  Diagnosis Date  . Hypertension   . Diabetes mellitus without complication   . Breast cancer     ALLERGIES:  has No Known Allergies.  MEDICATIONS:  Current Outpatient Prescriptions  Medication Sig Dispense Refill  . albuterol (PROVENTIL HFA;VENTOLIN HFA) 108 (90 BASE) MCG/ACT inhaler Inhale 2 puffs into the lungs every 6 (six) hours as needed for wheezing.      Marland Kitchen amLODipine (NORVASC) 5 MG tablet Take 5 mg by mouth daily.      Marland Kitchen atorvastatin (LIPITOR) 10 MG tablet Take 10 mg by mouth daily.      . bisacodyl (BISCOLAX) 10 MG suppository Place 10 mg rectally as needed for constipation.      . cyclobenzaprine (FLEXERIL) 10 MG tablet Take 10 mg by mouth 2 (two) times daily as needed for muscle spasms.       Marland Kitchen everolimus (AFINITOR) 5 MG tablet Take 1 tablet (5 mg total) by mouth daily.  30 tablet  2  . exemestane (AROMASIN) 25 MG tablet Take 1 tablet (25 mg total) by mouth daily after breakfast.  30 tablet  2  . glipiZIDE (GLUCOTROL) 10 MG tablet Take 10-20 mg by mouth 2 (two)  times daily before a meal. Takes 2 in am and 1 in evening      . HYDROcodone-acetaminophen (NORCO/VICODIN) 5-325 MG per tablet Take 1 tablet by mouth every 4 (four) hours as needed.  30 tablet  0  . LORazepam (ATIVAN) 0.5 MG tablet Take 1 tablet (0.5 mg total) by mouth every 6 (six) hours as needed for anxiety.  30 tablet  1  . Paliperidone Palmitate (INVEGA SUSTENNA IM) Inject into the muscle every 30 (thirty) days. Injections once monthly at [facility] last given on 02/01/2013.      . pantoprazole (PROTONIX) 40 MG tablet Take 40 mg by mouth daily.       . predniSONE (DELTASONE) 20 MG tablet 20 mg daily. Take 40 mg tablet today, taper down by 10 mg daily until completed      . simvastatin (ZOCOR) 20 MG tablet Take 20 mg by mouth every evening.      . traMADol (ULTRAM) 50 MG tablet Take 1 tablet (50 mg total) by mouth every 6 (six) hours as needed for pain.  60 tablet  0   Current Facility-Administered Medications  Medication Dose Route Frequency Provider Last Rate Last Dose  . denosumab (XGEVA) injection 120 mg  120 mg Subcutaneous Once Augustin Schooling, NP        SURGICAL HISTORY:  Past Surgical History  Procedure Laterality Date  . Breast surgery      REVIEW OF SYSTEMS:   General: fatigue (-), night sweats (-), fever (-), pain (-) Lymph: palpable nodes (-) HEENT: vision changes (-), mucositis (-), gum bleeding (-), epistaxis (-) Cardiovascular: chest pain (-), palpitations (-) Pulmonary: shortness of breath (-), dyspnea on exertion (-), cough (-), hemoptysis (-) GI:  Early satiety (-), melena (-), dysphagia (-), nausea/vomiting (-), diarrhea (-) GU: dysuria (-), hematuria (-), incontinence (-) Musculoskeletal: joint swelling (-), joint pain (-), back pain (-) Neuro: weakness (-), numbness (-), headache (-), confusion (-) Skin: Rash (-), lesions (-), dryness (-) Psych: depression (-), suicidal/homicidal ideation (-), feeling of hopelessness (-)  PHYSICAL EXAMINATION: Blood pressure 112/66, pulse 73, temperature 98.6 F (37 C), temperature source Oral, resp. rate 20, height 5\' 2"  (1.575 m), weight 207 lb (93.895 kg). Body mass index is 37.85 kg/(m^2). General: Patient is a well appearing female in no acute distress HEENT: PERRLA, sclerae anicteric no conjunctival pallor, MMM Neck: supple, no palpable adenopathy Lungs: clear to auscultation bilaterally, no wheezes, rhonchi, or rales Cardiovascular: regular rate rhythm, S1, S2, no murmurs, rubs or gallops Abdomen: Soft, non-tender, non-distended, normoactive bowel sounds, no  HSM Extremities: warm and well perfused, no clubbing, cyanosis, left non-pitting edema Skin: No rashes or lesions Neuro: Non-focal Breast: left mastectomy site intact, no nodularity, right breast with numerous cancerous lesions.   ECOG PERFORMANCE STATUS: 2 - Symptomatic, <50% confined to bed   LABORATORY DATA: Lab Results  Component Value Date   WBC 5.8 03/29/2013   HGB 11.0* 03/29/2013   HCT 34.2* 03/29/2013   MCV 89.0 03/29/2013   PLT 273 03/29/2013      Chemistry      Component Value Date/Time   NA 139 03/29/2013 1044   NA 131* 02/23/2013 0535   K 4.5 03/29/2013 1044   K 4.7 02/23/2013 0535   CL 108* 03/29/2013 1044   CL 95* 02/23/2013 0535   CO2 21* 03/29/2013 1044   CO2 25 02/23/2013 0535   BUN 28.4* 03/29/2013 1044   BUN 37* 02/23/2013 0535   CREATININE 1.8*  03/29/2013 1044   CREATININE 1.17* 02/23/2013 0535   CREATININE 1.41* 01/27/2013 1517      Component Value Date/Time   CALCIUM 8.9 03/29/2013 1044   CALCIUM 9.6 02/23/2013 0535   ALKPHOS 70 03/29/2013 1044   ALKPHOS 80 02/18/2013 0430   AST 19 03/29/2013 1044   AST 16 02/18/2013 0430   ALT 15 03/29/2013 1044   ALT 7 02/18/2013 0430   BILITOT 0.27 03/29/2013 1044   BILITOT 0.1* 02/18/2013 0430       RADIOGRAPHIC STUDIES:  Dg Chest 2 View  02/17/2013  *RADIOLOGY REPORT*  Clinical Data: Cough and shortness of breath.  Hypertension and diabetes.  CHEST - 2 VIEW  Comparison: CT 04/01/2011 and plain film 11/20/2009  Findings: Surgical clips within the left axilla.  Patient rotated left. Cardiomegaly accentuated by AP portable technique.  Small to moderate left-sided pleural effusion. No pneumothorax.  Low lung volumes with resultant pulmonary interstitial prominence.  Left sided airspace disease which is new.  IMPRESSION: Left-sided pleural effusion and adjacent infection or atelectasis. Recommend radiographic follow-up until clearing.  Cardiomegaly with low lung volumes on the frontal.  Interstitial prominence is favored to be  secondary.  Concurrent mild pulmonary venous congestion is difficult to exclude.   Original Report Authenticated By: Jeronimo Greaves, M.D.    Ct Chest Wo Contrast  02/18/2013  *RADIOLOGY REPORT*  Clinical Data: Shortness of breath.  Pleural effusion.  History of left-sided breast cancers status post left mastectomy, chemotherapy and radiation therapy now complete.  CT CHEST WITHOUT CONTRAST  Technique:  Multidetector CT imaging of the chest was performed following the standard protocol without IV contrast.  Comparison: Chest CT 04/01/2011.  Findings:  Mediastinum: Extensive anterior mediastinal lymphadenopathy is now noted, with the largest lymph nodes measuring up to 1.4-1.7 cm in short axis. Enlarged lymph nodes are also noted the superior mediastinum immediately anterior to the origin of the left common carotid artery measuring up to 1.5 cm in short axis.  Heart size is normal. There is no significant pericardial fluid, thickening or pericardial calcification. There is atherosclerosis of the thoracic aorta, the great vessels of the mediastinum and the coronary arteries, including calcified atherosclerotic plaque in the left main, left anterior descending and right coronary arteries. Esophagus is unremarkable in appearance.  Lungs/Pleura: Although difficult to assess for certain on the noncontrast CT examination, there appears to be multifocal pleural- based nodularity in the left hemithorax, best demonstrated on image 39 of series 2 where there is an apparent 2.6 x 1.4 cm pleural based nodule in the medial aspect of the left hemithorax.  There is a new moderate left sided pleural effusion which is likely malignant.  Extensive passive atelectasis in the left lower lobe which obscures assessment of the lung parenchyma for underlying pulmonary nodules.  Subpleural reticulation in the anterolateral aspect of the left upper lobe is likely related to a prior left- sided breast radiation therapy.  No definite suspicious  appearing pulmonary nodules or masses are noted within the aerated portions of the lungs, although there are a few scattered 1-2 mm pulmonary nodules which are highly nonspecific.  Upper Abdomen: Soft tissue in the upper retroperitoneum immediately anterolateral to the aorta (image 59 of series 2) measuring 3.0 x 2.1 cm, suspicious for lymphadenopathy.  Additional smaller soft tissue lesions are also noted throughout the visualized upper abdomen.  Extensive edema is noted throughout the left upper abdominal wall anteriorly and laterally.  Musculoskeletal: Status post left modified radical mastectomy and axillary nodal dissection.  There is extensive skin thickening throughout the right breast, and edema throughout the right breast as well as the chest wall bilaterally.  In the right axilla there are numerous enlarged lymph nodes, including the largest node which is 4.7 x 3.8 cm.  Extensive subpectoral lymphadenopathy is also noted.  There are no aggressive appearing lytic or blastic lesions noted in the visualized portions of the skeleton.  IMPRESSION: 1.  Findings, as above, highly concerning for possible inflammatory breast cancer in the right breast, with extensive right axillary, subpectoral, mediastinal and retroperitoneal lymphadenopathy. There is also likely a malignant left pleural effusion. 2. Atherosclerosis, including left main and two-vessel coronary artery disease. 3.  Additional incidental findings, as above.  These results were called by telephone on 02/18/2013 at 03:20 p.m. to Dr. Elvera Lennox, who verbally acknowledged these results.   Original Report Authenticated By: Trudie Reed, M.D.    Dg Chest Port 1 View  02/20/2013  *RADIOLOGY REPORT*  Clinical Data: Follow up left lower lobe atelectasis and/or pneumonia and left pleural effusion.  PORTABLE CHEST - 1 VIEW 02/20/2013 0819 hours:  Comparison: Portable chest x-ray and CT chest yesterday.  Two-view chest x-ray 02/17/2013.  Findings: Persistent  dense consolidation in the left lower lobe and moderate-sized left pleural effusion, unchanged.  Cardiac silhouette normal in size, unchanged.  Mild pulmonary venous hypertension without overt edema.  Prior left axillary node dissection.  IMPRESSION: Stable dense left lower lobe atelectasis or pneumonia and moderate- sized left pleural effusion.  Developing pulmonary venous hypertension without overt edema, query incipient fluid overload.   Original Report Authenticated By: Hulan Saas, M.D.    Portable Chest 1 View  02/18/2013  *RADIOLOGY REPORT*  Clinical Data: Pleural effusion.  PORTABLE CHEST - 1 VIEW  Comparison: 02/17/2013.  Findings: Trachea is midline.  Heart size grossly stable. Bihilar prominence.  Left basilar collapse/consolidation and left pleural effusion appear unchanged. Postoperative changes of left mastectomy.  Surgical clips in the left axilla.  IMPRESSION:  Left lower lobe collapse/consolidation and left pleural effusion with left hilar prominence.  A centrally obstructing mass with postobstructive pneumonitis/pneumonia could have this appearance. CT chest with contrast would be helpful in further evaluation, as clinically indicated.   Original Report Authenticated By: Leanna Battles, M.D.     ASSESSMENT:  Ms. Eskridge is a 79 year old female with stage IV ER positive PR positive, HER-2/neu negative invasive ductal carcinoma.  Her last CT scans were 02/18/13.     PLAN:  1. Doing well.  We did call [facility] care and they sent [place] a list of her discharge medication.  I was able to compile a list and the son will review the list with the medications at home.  Patient is to continue Aromasin and Afinitor daily. The family was requested to bring all pill bottles to the next appointment.    2. Reviewed the additional PET scan and bone scan with patient and her son.  I discussed Xgeva with the patient.  We have started the process of this treatment with the patient.  She will  return next week for this injection.  I did print out information regarding Xgeva on her AVS.    3. Referred the patient to lymphedema clinic for the left arm.    4.  We will see Ms. Lheureux back in one month.    All questions were answered. The patient knows to call the clinic with any problems, questions or concerns. We can certainly see the patient much sooner if  necessary.  I spent 40 minutes counseling the patient face to face. The total time spent in the appointment was 50 minutes.  Cherie Ouch Lyn Hollingshead, NP Medical Oncology [facility] Phone: [phone] 03/29/2013, 1:25 PM

## 2013-04-05 ENCOUNTER — Ambulatory Visit: Payer: Medicare Other

## 2013-04-05 DIAGNOSIS — C50419 Malignant neoplasm of upper-outer quadrant of unspecified female breast: Secondary | ICD-10-CM

## 2013-04-05 MED ORDER — DENOSUMAB 120 MG/1.7ML ~~LOC~~ SOLN: 120.0000 mg | Freq: Once | SUBCUTANEOUS | Status: DC

## 2013-04-06 ENCOUNTER — Ambulatory Visit: Payer: Medicare Other | Admitting: Physical Therapy

## 2013-04-07 ENCOUNTER — Ambulatory Visit: Payer: Medicare Other | Attending: General Surgery | Admitting: Physical Therapy

## 2013-04-07 DIAGNOSIS — I890 Lymphedema, not elsewhere classified: Secondary | ICD-10-CM | POA: Insufficient documentation

## 2013-04-07 DIAGNOSIS — IMO0001 Reserved for inherently not codable concepts without codable children: Secondary | ICD-10-CM | POA: Insufficient documentation

## 2013-04-12 ENCOUNTER — Encounter: Payer: Self-pay | Admitting: Adult Health

## 2013-04-12 NOTE — Progress Notes (Signed)
This encounter was created in error - please disregard.

## 2013-04-13 ENCOUNTER — Other Ambulatory Visit (INDEPENDENT_AMBULATORY_CARE_PROVIDER_SITE_OTHER): Payer: Self-pay | Admitting: General Surgery

## 2013-04-19 ENCOUNTER — Other Ambulatory Visit (INDEPENDENT_AMBULATORY_CARE_PROVIDER_SITE_OTHER): Payer: Self-pay | Admitting: General Surgery

## 2013-04-19 ENCOUNTER — Ambulatory Visit: Payer: Medicare Other | Admitting: Physical Therapy

## 2013-04-19 DIAGNOSIS — C50911 Malignant neoplasm of unspecified site of right female breast: Secondary | ICD-10-CM

## 2013-04-19 MED ORDER — TRAMADOL HCL 50 MG PO TABS: 50.0000 mg | ORAL_TABLET | Freq: Four times a day (QID) | ORAL | Status: DC | PRN

## 2013-04-21 ENCOUNTER — Encounter: Payer: Medicare Other | Admitting: Physical Therapy

## 2013-05-03 ENCOUNTER — Other Ambulatory Visit: Payer: Medicare Other | Admitting: Lab

## 2013-05-03 ENCOUNTER — Ambulatory Visit: Payer: Medicare Other

## 2013-05-03 ENCOUNTER — Other Ambulatory Visit: Payer: Self-pay

## 2013-05-03 ENCOUNTER — Ambulatory Visit: Payer: Medicare Other | Admitting: Adult Health

## 2013-05-03 DIAGNOSIS — C50411 Malignant neoplasm of upper-outer quadrant of right female breast: Secondary | ICD-10-CM

## 2013-05-03 DIAGNOSIS — C50911 Malignant neoplasm of unspecified site of right female breast: Secondary | ICD-10-CM

## 2013-05-05 ENCOUNTER — Encounter: Payer: Medicare Other | Admitting: Physical Therapy

## 2013-05-05 ENCOUNTER — Telehealth: Payer: Self-pay | Admitting: Medical Oncology

## 2013-05-05 NOTE — Telephone Encounter (Signed)
Patient LVMOM asking for a sooner appt with Augustin Schooling, NP then then one she is currently scheduled for d/t missed appt.  Sched 05/31/13 lab/NP/tx  Mssg forwarded to NP/MD

## 2013-05-06 ENCOUNTER — Telehealth: Payer: Self-pay | Admitting: *Deleted

## 2013-05-06 ENCOUNTER — Telehealth: Payer: Self-pay | Admitting: Medical Oncology

## 2013-05-06 NOTE — Telephone Encounter (Signed)
sw pt gv appt d/t for labs, ov, and inj on 05/10/13 starting at 9:45am. Pt is aware...td

## 2013-05-06 NOTE — Telephone Encounter (Signed)
Wednesday please

## 2013-05-06 NOTE — Telephone Encounter (Signed)
Please reschedule patient next week with NP

## 2013-05-06 NOTE — Telephone Encounter (Signed)
Per MD, patient to be r/s for week of 30th with Augustin Schooling, NP. Onc treatment sent. Sched to notify pt.

## 2013-05-06 NOTE — Telephone Encounter (Signed)
sw pt inform her that LA would like to see her sooner than 05/31/13. gv her 05/11/13 appts as LA requested but the pt stated she can make it because her son is available on tues and thurs...td

## 2013-05-10 ENCOUNTER — Telehealth: Payer: Self-pay | Admitting: *Deleted

## 2013-05-10 ENCOUNTER — Encounter: Payer: Self-pay | Admitting: Adult Health

## 2013-05-10 ENCOUNTER — Ambulatory Visit (HOSPITAL_BASED_OUTPATIENT_CLINIC_OR_DEPARTMENT_OTHER): Payer: Medicare Other | Admitting: Adult Health

## 2013-05-10 ENCOUNTER — Ambulatory Visit (HOSPITAL_BASED_OUTPATIENT_CLINIC_OR_DEPARTMENT_OTHER): Payer: Medicare Other

## 2013-05-10 ENCOUNTER — Other Ambulatory Visit (HOSPITAL_BASED_OUTPATIENT_CLINIC_OR_DEPARTMENT_OTHER): Payer: Medicare Other | Admitting: Lab

## 2013-05-10 VITALS — BP 126/75 | HR 82 | Temp 98.6°F | Resp 20 | Ht 62.0 in | Wt 194.4 lb

## 2013-05-10 DIAGNOSIS — C50419 Malignant neoplasm of upper-outer quadrant of unspecified female breast: Secondary | ICD-10-CM

## 2013-05-10 DIAGNOSIS — C50911 Malignant neoplasm of unspecified site of right female breast: Secondary | ICD-10-CM

## 2013-05-10 DIAGNOSIS — L0390 Cellulitis, unspecified: Secondary | ICD-10-CM

## 2013-05-10 DIAGNOSIS — I972 Postmastectomy lymphedema syndrome: Secondary | ICD-10-CM

## 2013-05-10 DIAGNOSIS — Z923 Personal history of irradiation: Secondary | ICD-10-CM

## 2013-05-10 DIAGNOSIS — Z170 Estrogen receptor positive status [ER+]: Secondary | ICD-10-CM

## 2013-05-10 DIAGNOSIS — M949 Disorder of cartilage, unspecified: Secondary | ICD-10-CM

## 2013-05-10 DIAGNOSIS — C50411 Malignant neoplasm of upper-outer quadrant of right female breast: Secondary | ICD-10-CM

## 2013-05-10 LAB — COMPREHENSIVE METABOLIC PANEL (CC13)
Alkaline Phosphatase: 94 U/L (ref 40–150)
Glucose: 91 mg/dl (ref 70–140)
Sodium: 140 mEq/L (ref 136–145)
Total Bilirubin: 0.33 mg/dL (ref 0.20–1.20)
Total Protein: 7.3 g/dL (ref 6.4–8.3)

## 2013-05-10 LAB — CBC WITH DIFFERENTIAL/PLATELET
Eosinophils Absolute: 0.3 10*3/uL (ref 0.0–0.5)
LYMPH%: 14.7 % (ref 14.0–49.7)
MCH: 28.1 pg (ref 25.1–34.0)
MCHC: 33.4 g/dL (ref 31.5–36.0)
MCV: 84 fL (ref 79.5–101.0)
MONO%: 9.1 % (ref 0.0–14.0)
NEUT#: 5.6 10*3/uL (ref 1.5–6.5)
Platelets: 358 10*3/uL (ref 145–400)
RBC: 4.27 10*6/uL (ref 3.70–5.45)

## 2013-05-10 MED ORDER — DENOSUMAB 120 MG/1.7ML ~~LOC~~ SOLN
120.0000 mg | Freq: Once | SUBCUTANEOUS | Status: AC
  Administered 2013-05-10: 120 mg via SUBCUTANEOUS
  Filled 2013-05-10: qty 1.7

## 2013-05-10 MED ORDER — CEPHALEXIN 500 MG PO CAPS: 500.0000 mg | ORAL_CAPSULE | Freq: Two times a day (BID) | ORAL | Status: DC

## 2013-05-10 NOTE — Progress Notes (Signed)
OFFICE PROGRESS NOTE  CCGeorgianne Fick, MD [address] [place] [address]  DIAGNOSIS: 79 year old female with Stage IV ER positive, PR positive, HER/2-neu negative invasive ductal carcinoma.   PRIOR THERAPY: 1. Patient is found a lump in her left breast sometime in 2010 and eventually sought medical treatment.  She was found to have stage III invasive ductal carcinoma.  She underwent left lumpectomy and axillary lymph node dissection on 05/03/09 for a pT2 pN2 invasive ductal carcinoma, grade 2, ER 100% positive, PR 99% positive, Mib-1 of 29%, and HER-2/neu negative.  She underwent left simple mastectomy on 06/04/09 to clear margins.  2.  She then underwent post-mastectomy radiation to the left chest wall.    3.  She was treated adjuvantly with Letrozole for approximately one year per the patient's recollection.    4. On 01/20/13 she had a right axillary lymph node biopsy that showed invasive ductal carcinoma, ER 100% positive, PR 27% positive, with an Mib-1 of 47%, and no HER-2 amplification.  Right breast biopsy on 01/31/09 showed invasive ductal carcinoma, grade three with similar profile to right axillary biopsy.    5. CT chest w/o contrast on 02/18/13 showed stage IV disease with extensive adenopathy including mediastinum and neck as well as apparent pleural spread on the left.  There was also extensive skin involvement.    6. Patient started on Arimidex in early April, 2014, however this was discontinued on 03/03/13 and Aromasin and Afinitor were started daily.   CURRENT THERAPY:  Aromasin 25mg  daily/Afinitor 5mg  daily  INTERVAL HISTORY: [name] 79 y.o. female returns for follow up of her stage IV invasive ductal carcinoma.  She is doing well today and is at home with her son.  She continues to have left arm lymphedema, and has noticed pus from the arm and red streaking for the past 4 days.  She feels as if the skin lesions on her right breast are  improving.  She has been to the lymphedema clinic and due to transportation and finances cannot commit to coming 3 times per week.  She denies fevers, chills, new pain, difficulty taking her medications, unintentional weight loss, or any further concerns.  She is taking the Aromasin and Afinitor daily and doing well with this.    MEDICAL HISTORY: Past Medical History  Diagnosis Date  . Hypertension   . Diabetes mellitus without complication   . Breast cancer     ALLERGIES:  has No Known Allergies.  MEDICATIONS:  Current Outpatient Prescriptions  Medication Sig Dispense Refill  . albuterol (PROVENTIL HFA;VENTOLIN HFA) 108 (90 BASE) MCG/ACT inhaler Inhale 2 puffs into the lungs every 6 (six) hours as needed for wheezing.      Marland Kitchen amLODipine (NORVASC) 5 MG tablet Take 5 mg by mouth daily.      Marland Kitchen atorvastatin (LIPITOR) 10 MG tablet Take 10 mg by mouth daily.      . bisacodyl (BISCOLAX) 10 MG suppository Place 10 mg rectally as needed for constipation.      . cyclobenzaprine (FLEXERIL) 10 MG tablet Take 10 mg by mouth 2 (two) times daily as needed for muscle spasms.       Marland Kitchen everolimus (AFINITOR) 5 MG tablet Take 1 tablet (5 mg total) by mouth daily.  30 tablet  2  . exemestane (AROMASIN) 25 MG tablet Take 1 tablet (25 mg total) by mouth daily after breakfast.  30 tablet  2  . glipiZIDE (GLUCOTROL) 10 MG tablet Take  10-20 mg by mouth 2 (two) times daily before a meal. Takes 2 in am and 1 in evening      . HYDROcodone-acetaminophen (NORCO/VICODIN) 5-325 MG per tablet Take 1 tablet by mouth every 4 (four) hours as needed.  30 tablet  0  . LORazepam (ATIVAN) 0.5 MG tablet Take 1 tablet (0.5 mg total) by mouth every 6 (six) hours as needed for anxiety.  30 tablet  1  . Paliperidone Palmitate (INVEGA SUSTENNA IM) Inject into the muscle every 30 (thirty) days. Injections once monthly at [facility] last given on 02/01/2013.      . pantoprazole (PROTONIX) 40 MG tablet  Take 40 mg by mouth daily.      . predniSONE (DELTASONE) 20 MG tablet 20 mg daily. Take 40 mg tablet today, taper down by 10 mg daily until completed      . simvastatin (ZOCOR) 20 MG tablet Take 20 mg by mouth every evening.      . traMADol (ULTRAM) 50 MG tablet Take 1 tablet (50 mg total) by mouth every 6 (six) hours as needed for pain.  60 tablet  0   No current facility-administered medications for this visit.    SURGICAL HISTORY:  Past Surgical History  Procedure Laterality Date  . Breast surgery      REVIEW OF SYSTEMS:   General: fatigue (-), night sweats (-), fever (-), pain (-) Lymph: palpable nodes (-) HEENT: vision changes (-), mucositis (-), gum bleeding (-), epistaxis (-) Cardiovascular: chest pain (-), palpitations (-) Pulmonary: shortness of breath (-), dyspnea on exertion (-), cough (-), hemoptysis (-) GI:  Early satiety (-), melena (-), dysphagia (-), nausea/vomiting (-), diarrhea (-) GU: dysuria (-), hematuria (-), incontinence (-) Musculoskeletal: joint swelling (-), joint pain (-), back pain (-) Neuro: weakness (-), numbness (-), headache (-), confusion (-) Skin: Rash (-), lesions (-), dryness (-) Psych: depression (-), suicidal/homicidal ideation (-), feeling of hopelessness (-)  PHYSICAL EXAMINATION: Blood pressure 126/75, pulse 82, temperature 98.6 F (37 C), temperature source Oral, resp. rate 20, height 5\' 2"  (1.575 m), weight 194 lb 6.4 oz (88.179 kg). Body mass index is 35.55 kg/(m^2). General: Patient is a well appearing female in no acute distress HEENT: PERRLA, sclerae anicteric no conjunctival pallor, MMM Neck: supple, no palpable adenopathy Lungs: clear to auscultation bilaterally, no wheezes, rhonchi, or rales Cardiovascular: regular rate rhythm, S1, S2, no murmurs, rubs or gallops Abdomen: Soft, non-tender, non-distended, normoactive bowel sounds, no HSM Extremities: warm and well perfused, no clubbing, cyanosis, left non-pitting edema, very  large Skin: No rashes or lesions Neuro: Non-focal Breast: left mastectomy site intact, no nodularity, right breast with numerous cancerous lesions.   ECOG PERFORMANCE STATUS: 2 - Symptomatic, <50% confined to bed   LABORATORY DATA: Lab Results  Component Value Date   WBC 7.8 05/10/2013   HGB 12.0 05/10/2013   HCT 35.8 05/10/2013   MCV 84.0 05/10/2013   PLT 358 05/10/2013      Chemistry      Component Value Date/Time   NA 140 05/10/2013 1014   NA 131* 02/23/2013 0535   K 4.3 05/10/2013 1014   K 4.7 02/23/2013 0535   CL 108* 03/29/2013 1044   CL 95* 02/23/2013 0535   CO2 22 05/10/2013 1014   CO2 25 02/23/2013 0535   BUN 23.5 05/10/2013 1014   BUN 37* 02/23/2013 0535   CREATININE 1.6* 05/10/2013 1014   CREATININE 1.17* 02/23/2013 0535   CREATININE 1.41* 01/27/2013 1517  Component Value Date/Time   CALCIUM 9.2 05/10/2013 1014   CALCIUM 9.6 02/23/2013 0535   ALKPHOS 94 05/10/2013 1014   ALKPHOS 80 02/18/2013 0430   AST 27 05/10/2013 1014   AST 16 02/18/2013 0430   ALT 7 05/10/2013 1014   ALT 7 02/18/2013 0430   BILITOT 0.33 05/10/2013 1014   BILITOT 0.1* 02/18/2013 0430       RADIOGRAPHIC STUDIES:  Dg Chest 2 View  02/17/2013  *RADIOLOGY REPORT*  Clinical Data: Cough and shortness of breath.  Hypertension and diabetes.  CHEST - 2 VIEW  Comparison: CT 04/01/2011 and plain film 11/20/2009  Findings: Surgical clips within the left axilla.  Patient rotated left. Cardiomegaly accentuated by AP portable technique.  Small to moderate left-sided pleural effusion. No pneumothorax.  Low lung volumes with resultant pulmonary interstitial prominence.  Left sided airspace disease which is new.  IMPRESSION: Left-sided pleural effusion and adjacent infection or atelectasis. Recommend radiographic follow-up until clearing.  Cardiomegaly with low lung volumes on the frontal.  Interstitial prominence is favored to be secondary.  Concurrent mild pulmonary venous congestion is difficult to exclude.   Original Report  Authenticated By: Jeronimo Greaves, M.D.    Ct Chest Wo Contrast  02/18/2013  *RADIOLOGY REPORT*  Clinical Data: Shortness of breath.  Pleural effusion.  History of left-sided breast cancers status post left mastectomy, chemotherapy and radiation therapy now complete.  CT CHEST WITHOUT CONTRAST  Technique:  Multidetector CT imaging of the chest was performed following the standard protocol without IV contrast.  Comparison: Chest CT 04/01/2011.  Findings:  Mediastinum: Extensive anterior mediastinal lymphadenopathy is now noted, with the largest lymph nodes measuring up to 1.4-1.7 cm in short axis. Enlarged lymph nodes are also noted the superior mediastinum immediately anterior to the origin of the left common carotid artery measuring up to 1.5 cm in short axis.  Heart size is normal. There is no significant pericardial fluid, thickening or pericardial calcification. There is atherosclerosis of the thoracic aorta, the great vessels of the mediastinum and the coronary arteries, including calcified atherosclerotic plaque in the left main, left anterior descending and right coronary arteries. Esophagus is unremarkable in appearance.  Lungs/Pleura: Although difficult to assess for certain on the noncontrast CT examination, there appears to be multifocal pleural- based nodularity in the left hemithorax, best demonstrated on image 39 of series 2 where there is an apparent 2.6 x 1.4 cm pleural based nodule in the medial aspect of the left hemithorax.  There is a new moderate left sided pleural effusion which is likely malignant.  Extensive passive atelectasis in the left lower lobe which obscures assessment of the lung parenchyma for underlying pulmonary nodules.  Subpleural reticulation in the anterolateral aspect of the left upper lobe is likely related to a prior left- sided breast radiation therapy.  No definite suspicious appearing pulmonary nodules or masses are noted within the aerated portions of the lungs, although  there are a few scattered 1-2 mm pulmonary nodules which are highly nonspecific.  Upper Abdomen: Soft tissue in the upper retroperitoneum immediately anterolateral to the aorta (image 59 of series 2) measuring 3.0 x 2.1 cm, suspicious for lymphadenopathy.  Additional smaller soft tissue lesions are also noted throughout the visualized upper abdomen.  Extensive edema is noted throughout the left upper abdominal wall anteriorly and laterally.  Musculoskeletal: Status post left modified radical mastectomy and axillary nodal dissection.  There is extensive skin thickening throughout the right breast, and edema throughout the right breast as well as  the chest wall bilaterally.  In the right axilla there are numerous enlarged lymph nodes, including the largest node which is 4.7 x 3.8 cm.  Extensive subpectoral lymphadenopathy is also noted.  There are no aggressive appearing lytic or blastic lesions noted in the visualized portions of the skeleton.  IMPRESSION: 1.  Findings, as above, highly concerning for possible inflammatory breast cancer in the right breast, with extensive right axillary, subpectoral, mediastinal and retroperitoneal lymphadenopathy. There is also likely a malignant left pleural effusion. 2. Atherosclerosis, including left main and two-vessel coronary artery disease. 3.  Additional incidental findings, as above.  These results were called by telephone on 02/18/2013 at 03:20 p.m. to Dr. Elvera Lennox, who verbally acknowledged these results.   Original Report Authenticated By: Trudie Reed, M.D.    Dg Chest Port 1 View  02/20/2013  *RADIOLOGY REPORT*  Clinical Data: Follow up left lower lobe atelectasis and/or pneumonia and left pleural effusion.  PORTABLE CHEST - 1 VIEW 02/20/2013 0819 hours:  Comparison: Portable chest x-ray and CT chest yesterday.  Two-view chest x-ray 02/17/2013.  Findings: Persistent dense consolidation in the left lower lobe and moderate-sized left pleural effusion, unchanged.   Cardiac silhouette normal in size, unchanged.  Mild pulmonary venous hypertension without overt edema.  Prior left axillary node dissection.  IMPRESSION: Stable dense left lower lobe atelectasis or pneumonia and moderate- sized left pleural effusion.  Developing pulmonary venous hypertension without overt edema, query incipient fluid overload.   Original Report Authenticated By: Hulan Saas, M.D.    Portable Chest 1 View  02/18/2013  *RADIOLOGY REPORT*  Clinical Data: Pleural effusion.  PORTABLE CHEST - 1 VIEW  Comparison: 02/17/2013.  Findings: Trachea is midline.  Heart size grossly stable. Bihilar prominence.  Left basilar collapse/consolidation and left pleural effusion appear unchanged. Postoperative changes of left mastectomy.  Surgical clips in the left axilla.  IMPRESSION:  Left lower lobe collapse/consolidation and left pleural effusion with left hilar prominence.  A centrally obstructing mass with postobstructive pneumonitis/pneumonia could have this appearance. CT chest with contrast would be helpful in further evaluation, as clinically indicated.   Original Report Authenticated By: Leanna Battles, M.D.     ASSESSMENT:  Ms. Arthurs is a 79 year old female with stage IV ER positive PR positive, HER-2/neu negative invasive ductal carcinoma.  Her last CT scans were 02/18/13.     PLAN:  1. Doing well.  We will order follow up scans at her next appointment to evaluate her response to treatment.    2. Patient will start Xgeva today.  She has missed several of her injection appointments.    3. I called Kathrin Penner our social worker to evaluate what assistance Ms Espiritu might be eligible for financially, and transportation wise.  If that doesn't work, I will talk to Dorothyann Gibbs about what we can do to help with the patient's lymphedema.      4.  We will see Ms. Fabrizio back in one month.    All questions were answered. The patient knows to call the clinic with any problems, questions or  concerns. We can certainly see the patient much sooner if necessary.  I spent 25 minutes counseling the patient face to face. The total time spent in the appointment was 30 minutes.  Cherie Ouch Lyn Hollingshead, NP Medical Oncology [facility] Phone: [phone] 05/10/2013, 11:39 AM

## 2013-05-10 NOTE — Patient Instructions (Addendum)
I have prescribed Keflex for likely right arm cellulitis.  If it continues to worsen please call immediately.  We will see you back in 1 month.  Please call us if you have any questions or concerns.

## 2013-05-10 NOTE — Telephone Encounter (Signed)
appts made and printed...td 

## 2013-05-11 ENCOUNTER — Other Ambulatory Visit: Payer: Medicare Other | Admitting: Lab

## 2013-05-11 ENCOUNTER — Ambulatory Visit: Payer: Medicare Other

## 2013-05-11 ENCOUNTER — Ambulatory Visit: Payer: Medicare Other | Admitting: Adult Health

## 2013-05-12 ENCOUNTER — Other Ambulatory Visit: Payer: Self-pay | Admitting: Emergency Medicine

## 2013-05-12 MED ORDER — HYDROCODONE-ACETAMINOPHEN 5-325 MG PO TABS: 1.0000 | ORAL_TABLET | ORAL | Status: DC | PRN

## 2013-05-16 ENCOUNTER — Encounter: Payer: Self-pay | Admitting: *Deleted

## 2013-05-16 ENCOUNTER — Encounter: Payer: Self-pay | Admitting: Oncology

## 2013-05-16 NOTE — Progress Notes (Signed)
Lillia Abed left message for me to call her back. She advised that the patient is in need of financial assistance. She said to speak with the son-[name]. I will call him

## 2013-05-16 NOTE — Progress Notes (Signed)
[facility] Clinical Social Work  Clinical Social Work was referred by Candida Peeling, NP, for assessment of psychosocial needs.  Clinical Social Worker contacted patient at home and spoke with patient's son to offer support and assess for needs.  Patient's son, [name], states they need help with transportation; he is unable to transport her to medical appointments because he has dialysis 3x week.  CSW provided [name] with contact information for [employer] and [employer].  CSW and patient's son agree if community agencies are not suitable solutions, they will apply for SCAT services for mother.  Patient's son also expressed financial concerns; CSW provided him with contact information for [facility] financial counselor. Patient's son plans to contact CSW with any additional questions or concerns.  Kathrin Penner, MSW, LCSW Clinical Social Worker [facility] [phone]

## 2013-05-16 NOTE — Progress Notes (Signed)
Called and spoke with patient. I asked for [name] and she said he was not in. I didn't leave a message I told her I would call back for him.

## 2013-05-24 ENCOUNTER — Encounter (INDEPENDENT_AMBULATORY_CARE_PROVIDER_SITE_OTHER): Payer: Self-pay

## 2013-05-24 NOTE — Progress Notes (Unsigned)
Patient ID: [name], female   DOB: 07/15/1931, 79 y.o.   MRN: [id number] Rx for Tramadol HCL 50 mg tablet #60 w/ 1 refill was faxed to [facility]

## 2013-05-31 ENCOUNTER — Ambulatory Visit: Payer: Medicare Other

## 2013-05-31 ENCOUNTER — Ambulatory Visit: Payer: Medicare Other | Admitting: Adult Health

## 2013-05-31 ENCOUNTER — Other Ambulatory Visit: Payer: Medicare Other | Admitting: Lab

## 2013-06-06 ENCOUNTER — Other Ambulatory Visit: Payer: Self-pay | Admitting: Emergency Medicine

## 2013-06-06 NOTE — Progress Notes (Signed)
Patient requested appointments be changed to a Tuesday. Moved appointments to 8/5 per patient's request and patient aware of new appointment date and times.

## 2013-06-10 ENCOUNTER — Telehealth: Payer: Self-pay | Admitting: Medical Oncology

## 2013-06-10 NOTE — Telephone Encounter (Signed)
Patient LVMOM requesting nausea medication and stating having chest pain. Return call to patient, approx 10 minutes later, states nausea and chest pain to right side x 2 days. States she has been taking pain medication for it and it goes away. Upon further inquiring patient states pain "moves from left side to right side, feels heavy in my chest." Denies fever/vomitting/diarrhea. Advised patient she needs to go to the ED as soon as possible. Patient asking can she wait till her office visit with MD 08/05. Advised patient again she needs to go to ED asap or call 911. Patient states she will call 911.

## 2013-06-13 ENCOUNTER — Other Ambulatory Visit: Payer: Self-pay | Admitting: Medical Oncology

## 2013-06-13 ENCOUNTER — Ambulatory Visit: Payer: Medicare Other | Admitting: Adult Health

## 2013-06-13 ENCOUNTER — Other Ambulatory Visit: Payer: Medicare Other | Admitting: Lab

## 2013-06-13 ENCOUNTER — Ambulatory Visit: Payer: Medicare Other

## 2013-06-13 DIAGNOSIS — C50911 Malignant neoplasm of unspecified site of right female breast: Secondary | ICD-10-CM

## 2013-06-14 ENCOUNTER — Other Ambulatory Visit: Payer: Medicare Other | Admitting: Lab

## 2013-06-14 ENCOUNTER — Ambulatory Visit: Payer: Medicare Other | Admitting: Adult Health

## 2013-06-14 ENCOUNTER — Ambulatory Visit: Payer: Medicare Other

## 2013-06-14 ENCOUNTER — Telehealth: Payer: Self-pay | Admitting: Medical Oncology

## 2013-06-14 NOTE — Telephone Encounter (Signed)
Patient called to cancel appt for today with NP @ 1:15, states "I'm not feeling well" advised pt then she really needs to come to today's appt. Pt states " I don't feel like bathing, I'm feeling nauseous from the antibiotics," again I encouraged pt to come to todays appt to be evaluated. Patient states she can't and asked to be r/s for next Tuesday. Advised patient to call should she need anything or nausea becomes worse. Patient gave verbal understanding. Mssg forwarded to NP/MD  Onc tx sent.

## 2013-06-16 ENCOUNTER — Telehealth: Payer: Self-pay | Admitting: Oncology

## 2013-06-16 NOTE — Telephone Encounter (Signed)
, °

## 2013-06-21 ENCOUNTER — Encounter: Payer: Self-pay | Admitting: Adult Health

## 2013-06-21 ENCOUNTER — Ambulatory Visit (HOSPITAL_BASED_OUTPATIENT_CLINIC_OR_DEPARTMENT_OTHER): Payer: Medicare Other

## 2013-06-21 ENCOUNTER — Ambulatory Visit (HOSPITAL_BASED_OUTPATIENT_CLINIC_OR_DEPARTMENT_OTHER): Payer: Medicare Other | Admitting: Adult Health

## 2013-06-21 ENCOUNTER — Other Ambulatory Visit (HOSPITAL_BASED_OUTPATIENT_CLINIC_OR_DEPARTMENT_OTHER): Payer: Medicare Other | Admitting: Lab

## 2013-06-21 ENCOUNTER — Telehealth: Payer: Self-pay | Admitting: *Deleted

## 2013-06-21 VITALS — BP 149/76 | HR 82 | Temp 98.4°F | Resp 20 | Ht 62.0 in | Wt 200.1 lb

## 2013-06-21 DIAGNOSIS — C50419 Malignant neoplasm of upper-outer quadrant of unspecified female breast: Secondary | ICD-10-CM

## 2013-06-21 DIAGNOSIS — C7952 Secondary malignant neoplasm of bone marrow: Secondary | ICD-10-CM

## 2013-06-21 DIAGNOSIS — Z170 Estrogen receptor positive status [ER+]: Secondary | ICD-10-CM

## 2013-06-21 DIAGNOSIS — C7951 Secondary malignant neoplasm of bone: Secondary | ICD-10-CM

## 2013-06-21 DIAGNOSIS — C50411 Malignant neoplasm of upper-outer quadrant of right female breast: Secondary | ICD-10-CM

## 2013-06-21 DIAGNOSIS — C773 Secondary and unspecified malignant neoplasm of axilla and upper limb lymph nodes: Secondary | ICD-10-CM

## 2013-06-21 DIAGNOSIS — Z9010 Acquired absence of unspecified breast and nipple: Secondary | ICD-10-CM

## 2013-06-21 DIAGNOSIS — I890 Lymphedema, not elsewhere classified: Secondary | ICD-10-CM

## 2013-06-21 DIAGNOSIS — C792 Secondary malignant neoplasm of skin: Secondary | ICD-10-CM

## 2013-06-21 DIAGNOSIS — C50911 Malignant neoplasm of unspecified site of right female breast: Secondary | ICD-10-CM

## 2013-06-21 LAB — COMPREHENSIVE METABOLIC PANEL (CC13)
Alkaline Phosphatase: 85 U/L (ref 40–150)
BUN: 22.3 mg/dL (ref 7.0–26.0)
Creatinine: 1.7 mg/dL — ABNORMAL HIGH (ref 0.6–1.1)
Glucose: 149 mg/dl — ABNORMAL HIGH (ref 70–140)
Total Bilirubin: 0.32 mg/dL (ref 0.20–1.20)

## 2013-06-21 LAB — CBC WITH DIFFERENTIAL/PLATELET
Basophils Absolute: 0 10*3/uL (ref 0.0–0.1)
Eosinophils Absolute: 0.4 10*3/uL (ref 0.0–0.5)
HGB: 11.1 g/dL — ABNORMAL LOW (ref 11.6–15.9)
LYMPH%: 22.2 % (ref 14.0–49.7)
MCV: 84.9 fL (ref 79.5–101.0)
MONO%: 9.9 % (ref 0.0–14.0)
NEUT#: 3.4 10*3/uL (ref 1.5–6.5)
NEUT%: 59.8 % (ref 38.4–76.8)
Platelets: 388 10*3/uL (ref 145–400)
RBC: 4.02 10*6/uL (ref 3.70–5.45)

## 2013-06-21 MED ORDER — DENOSUMAB 120 MG/1.7ML ~~LOC~~ SOLN
120.0000 mg | Freq: Once | SUBCUTANEOUS | Status: AC
  Administered 2013-06-21: 120 mg via SUBCUTANEOUS
  Filled 2013-06-21: qty 1.7

## 2013-06-21 NOTE — Patient Instructions (Signed)
Stop taking the Afinitor.  We will refer you to see Dr. Dayton Scrape for consideration of radiation therapy.  You will also have a CT of your chest/abdomen, and pelvis.  You will return to clinic in 2 weeks.  Please call us if you have any questions or concerns.

## 2013-06-21 NOTE — Telephone Encounter (Signed)
appts made and printed. i already call cs for the pt she is aware of her appts...td

## 2013-06-21 NOTE — Progress Notes (Addendum)
OFFICE PROGRESS NOTE  CCGeorgianne Fick, MD [address] [place] [address]  DIAGNOSIS: 79 year old female with Stage IV ER positive, PR positive, HER/2-neu negative invasive ductal carcinoma.   PRIOR THERAPY: 1. Patient is found a lump in her left breast sometime in 2010 and eventually sought medical treatment.  She was found to have stage III invasive ductal carcinoma.  She underwent left lumpectomy and axillary lymph node dissection on 05/03/09 for a pT2 pN2 invasive ductal carcinoma, grade 2, ER 100% positive, PR 99% positive, Mib-1 of 29%, and HER-2/neu negative.  She underwent left simple mastectomy on 06/04/09 to clear margins.  2.  She then underwent post-mastectomy radiation to the left chest wall.    3.  She was treated adjuvantly with Letrozole for approximately one year per the patient's recollection.    4. On 01/20/13 she had a right axillary lymph node biopsy that showed invasive ductal carcinoma, ER 100% positive, PR 27% positive, with an Mib-1 of 47%, and no HER-2 amplification.  Right breast biopsy on 01/31/09 showed invasive ductal carcinoma, grade three with similar profile to right axillary biopsy.    5. CT chest w/o contrast on 02/18/13 showed stage IV disease with extensive adenopathy including mediastinum and neck as well as apparent pleural spread on the left.  There was also extensive skin involvement.    6. Patient started on Arimidex in early April, 2014, however this was discontinued on 03/03/13 and Aromasin and Afinitor were started daily.   CURRENT THERAPY:  Aromasin 25mg  daily/Afinitor 5mg  daily  INTERVAL HISTORY: [name] 79 y.o. female returns for follow up of her stage IV invasive ductal carcinoma.  She is doing well today and lives at home with her son. She has developed increasing subcutaneous lesions that are bleeding and painful.  She denies fevers, chills, vomiting.  She has mild nausea.  She continues to have lymphedema  and we have referred her to the lymphedema clinic, however due to her son requiring dialysis Monday, Wednesday, Friday, and financial difficulties in paying copays, the patient has not returned.  Otherwise, a 10 point ROS is negative.    MEDICAL HISTORY: Past Medical History  Diagnosis Date  . Hypertension   . Diabetes mellitus without complication   . Breast cancer     ALLERGIES:  has No Known Allergies.  MEDICATIONS:  Current Outpatient Prescriptions  Medication Sig Dispense Refill  . albuterol (PROVENTIL HFA;VENTOLIN HFA) 108 (90 BASE) MCG/ACT inhaler Inhale 2 puffs into the lungs every 6 (six) hours as needed for wheezing.      Marland Kitchen amLODipine (NORVASC) 5 MG tablet Take 5 mg by mouth daily.      Marland Kitchen atorvastatin (LIPITOR) 10 MG tablet Take 10 mg by mouth daily.      . bisacodyl (BISCOLAX) 10 MG suppository Place 10 mg rectally as needed for constipation.      . cephALEXin (KEFLEX) 500 MG capsule Take 1 capsule (500 mg total) by mouth 2 (two) times daily.  28 capsule  0  . cyclobenzaprine (FLEXERIL) 10 MG tablet Take 10 mg by mouth 2 (two) times daily as needed for muscle spasms.       Marland Kitchen everolimus (AFINITOR) 5 MG tablet Take 1 tablet (5 mg total) by mouth daily.  30 tablet  2  . exemestane (AROMASIN) 25 MG tablet Take 1 tablet (25 mg total) by mouth daily after breakfast.  30 tablet  2  . glipiZIDE (GLUCOTROL) 10 MG tablet Take 10-20  mg by mouth 2 (two) times daily before a meal. Takes 2 in am and 1 in evening      . HYDROcodone-acetaminophen (NORCO/VICODIN) 5-325 MG per tablet Take 1 tablet by mouth every 4 (four) hours as needed.  30 tablet  0  . LORazepam (ATIVAN) 0.5 MG tablet Take 1 tablet (0.5 mg total) by mouth every 6 (six) hours as needed for anxiety.  30 tablet  1  . Paliperidone Palmitate (INVEGA SUSTENNA IM) Inject into the muscle every 30 (thirty) days. Injections once monthly at [facility] last given on 02/01/2013.      . pantoprazole  (PROTONIX) 40 MG tablet Take 40 mg by mouth daily.      . predniSONE (DELTASONE) 20 MG tablet 20 mg daily. Take 40 mg tablet today, taper down by 10 mg daily until completed      . simvastatin (ZOCOR) 20 MG tablet Take 20 mg by mouth every evening.      . traMADol (ULTRAM) 50 MG tablet Take 1 tablet (50 mg total) by mouth every 6 (six) hours as needed for pain.  60 tablet  0   No current facility-administered medications for this visit.    SURGICAL HISTORY:  Past Surgical History  Procedure Laterality Date  . Breast surgery      REVIEW OF SYSTEMS:   General: fatigue (-), night sweats (-), fever (-), pain (+) Lymph: palpable nodes (-) HEENT: vision changes (-), mucositis (-), gum bleeding (-), epistaxis (-) Cardiovascular: chest pain (-), palpitations (-) Pulmonary: shortness of breath (-), dyspnea on exertion (-), cough (-), hemoptysis (-) GI:  Early satiety (-), melena (-), dysphagia (-), nausea/vomiting (+), diarrhea (-) GU: dysuria (-), hematuria (-), incontinence (-) Musculoskeletal: joint swelling (-), joint pain (-), back pain (-) Neuro: weakness (-), numbness (-), headache (-), confusion (-) Skin: Rash (-), lesions (+), dryness (-) Psych: depression (-), suicidal/homicidal ideation (-), feeling of hopelessness (-)  PHYSICAL EXAMINATION: Blood pressure 149/76, pulse 82, temperature 98.4 F (36.9 C), temperature source Oral, resp. rate 20, height 5\' 2"  (1.575 m), weight 200 lb 1.6 oz (90.765 kg). Body mass index is 36.59 kg/(m^2). General: Patient is a well appearing female in no acute distress HEENT: PERRLA, sclerae anicteric no conjunctival pallor, MMM Neck: supple, no palpable adenopathy Lungs: clear to auscultation bilaterally, no wheezes, rhonchi, or rales Cardiovascular: regular rate rhythm, S1, S2, no murmurs, rubs or gallops Abdomen: Soft, non-tender, non-distended, normoactive bowel sounds, no HSM Extremities: warm and well perfused, no clubbing, cyanosis, left  non-pitting edema, very large Skin: No rashes or lesions Neuro: Non-focal Breast: left mastectomy site intact, numerous cutaneous lesions on chest wall, on left upper arm, and on left upper back, right breast with numerous cancerous lesions.   ECOG PERFORMANCE STATUS: 2 - Symptomatic, <50% confined to bed   LABORATORY DATA: Lab Results  Component Value Date   WBC 5.8 06/21/2013   HGB 11.1* 06/21/2013   HCT 34.1* 06/21/2013   MCV 84.9 06/21/2013   PLT 388 06/21/2013      Chemistry      Component Value Date/Time   NA 136 06/21/2013 1455   NA 131* 02/23/2013 0535   K 5.4* 06/21/2013 1455   K 4.7 02/23/2013 0535   CL 108* 03/29/2013 1044   CL 95* 02/23/2013 0535   CO2 18* 06/21/2013 1455   CO2 25 02/23/2013 0535   BUN 22.3 06/21/2013 1455   BUN 37* 02/23/2013 0535   CREATININE 1.7* 06/21/2013 1455  CREATININE 1.17* 02/23/2013 0535   CREATININE 1.41* 01/27/2013 1517      Component Value Date/Time   CALCIUM 8.8 06/21/2013 1455   CALCIUM 9.6 02/23/2013 0535   ALKPHOS 85 06/21/2013 1455   ALKPHOS 80 02/18/2013 0430   AST 23 06/21/2013 1455   AST 16 02/18/2013 0430   ALT <6 06/21/2013 1455   ALT 7 02/18/2013 0430   BILITOT 0.32 06/21/2013 1455   BILITOT 0.1* 02/18/2013 0430       RADIOGRAPHIC STUDIES:  Dg Chest 2 View  02/17/2013  *RADIOLOGY REPORT*  Clinical Data: Cough and shortness of breath.  Hypertension and diabetes.  CHEST - 2 VIEW  Comparison: CT 04/01/2011 and plain film 11/20/2009  Findings: Surgical clips within the left axilla.  Patient rotated left. Cardiomegaly accentuated by AP portable technique.  Small to moderate left-sided pleural effusion. No pneumothorax.  Low lung volumes with resultant pulmonary interstitial prominence.  Left sided airspace disease which is new.  IMPRESSION: Left-sided pleural effusion and adjacent infection or atelectasis. Recommend radiographic follow-up until clearing.  Cardiomegaly with low lung volumes on the frontal.  Interstitial prominence is favored to  be secondary.  Concurrent mild pulmonary venous congestion is difficult to exclude.   Original Report Authenticated By: Jeronimo Greaves, M.D.    Ct Chest Wo Contrast  02/18/2013  *RADIOLOGY REPORT*  Clinical Data: Shortness of breath.  Pleural effusion.  History of left-sided breast cancers status post left mastectomy, chemotherapy and radiation therapy now complete.  CT CHEST WITHOUT CONTRAST  Technique:  Multidetector CT imaging of the chest was performed following the standard protocol without IV contrast.  Comparison: Chest CT 04/01/2011.  Findings:  Mediastinum: Extensive anterior mediastinal lymphadenopathy is now noted, with the largest lymph nodes measuring up to 1.4-1.7 cm in short axis. Enlarged lymph nodes are also noted the superior mediastinum immediately anterior to the origin of the left common carotid artery measuring up to 1.5 cm in short axis.  Heart size is normal. There is no significant pericardial fluid, thickening or pericardial calcification. There is atherosclerosis of the thoracic aorta, the great vessels of the mediastinum and the coronary arteries, including calcified atherosclerotic plaque in the left main, left anterior descending and right coronary arteries. Esophagus is unremarkable in appearance.  Lungs/Pleura: Although difficult to assess for certain on the noncontrast CT examination, there appears to be multifocal pleural- based nodularity in the left hemithorax, best demonstrated on image 39 of series 2 where there is an apparent 2.6 x 1.4 cm pleural based nodule in the medial aspect of the left hemithorax.  There is a new moderate left sided pleural effusion which is likely malignant.  Extensive passive atelectasis in the left lower lobe which obscures assessment of the lung parenchyma for underlying pulmonary nodules.  Subpleural reticulation in the anterolateral aspect of the left upper lobe is likely related to a prior left- sided breast radiation therapy.  No definite  suspicious appearing pulmonary nodules or masses are noted within the aerated portions of the lungs, although there are a few scattered 1-2 mm pulmonary nodules which are highly nonspecific.  Upper Abdomen: Soft tissue in the upper retroperitoneum immediately anterolateral to the aorta (image 59 of series 2) measuring 3.0 x 2.1 cm, suspicious for lymphadenopathy.  Additional smaller soft tissue lesions are also noted throughout the visualized upper abdomen.  Extensive edema is noted throughout the left upper abdominal wall anteriorly and laterally.  Musculoskeletal: Status post left modified radical mastectomy and axillary nodal dissection.  There is extensive  skin thickening throughout the right breast, and edema throughout the right breast as well as the chest wall bilaterally.  In the right axilla there are numerous enlarged lymph nodes, including the largest node which is 4.7 x 3.8 cm.  Extensive subpectoral lymphadenopathy is also noted.  There are no aggressive appearing lytic or blastic lesions noted in the visualized portions of the skeleton.  IMPRESSION: 1.  Findings, as above, highly concerning for possible inflammatory breast cancer in the right breast, with extensive right axillary, subpectoral, mediastinal and retroperitoneal lymphadenopathy. There is also likely a malignant left pleural effusion. 2. Atherosclerosis, including left main and two-vessel coronary artery disease. 3.  Additional incidental findings, as above.  These results were called by telephone on 02/18/2013 at 03:20 p.m. to Dr. Elvera Lennox, who verbally acknowledged these results.   Original Report Authenticated By: Trudie Reed, M.D.    Dg Chest Port 1 View  02/20/2013  *RADIOLOGY REPORT*  Clinical Data: Follow up left lower lobe atelectasis and/or pneumonia and left pleural effusion.  PORTABLE CHEST - 1 VIEW 02/20/2013 0819 hours:  Comparison: Portable chest x-ray and CT chest yesterday.  Two-view chest x-ray 02/17/2013.  Findings:  Persistent dense consolidation in the left lower lobe and moderate-sized left pleural effusion, unchanged.  Cardiac silhouette normal in size, unchanged.  Mild pulmonary venous hypertension without overt edema.  Prior left axillary node dissection.  IMPRESSION: Stable dense left lower lobe atelectasis or pneumonia and moderate- sized left pleural effusion.  Developing pulmonary venous hypertension without overt edema, query incipient fluid overload.   Original Report Authenticated By: Hulan Saas, M.D.    Portable Chest 1 View  02/18/2013  *RADIOLOGY REPORT*  Clinical Data: Pleural effusion.  PORTABLE CHEST - 1 VIEW  Comparison: 02/17/2013.  Findings: Trachea is midline.  Heart size grossly stable. Bihilar prominence.  Left basilar collapse/consolidation and left pleural effusion appear unchanged. Postoperative changes of left mastectomy.  Surgical clips in the left axilla.  IMPRESSION:  Left lower lobe collapse/consolidation and left pleural effusion with left hilar prominence.  A centrally obstructing mass with postobstructive pneumonitis/pneumonia could have this appearance. CT chest with contrast would be helpful in further evaluation, as clinically indicated.   Original Report Authenticated By: Leanna Battles, M.D.     ASSESSMENT:  Ms. Salamon is a 79 year old female with stage IV ER positive PR positive, HER-2/neu negative invasive ductal carcinoma.  Her last CT scans were 02/18/13.  She is currently on Afinitor 5mg  and Aromasin 25 mg.  However, it appears she may have recurrence and we will stop the Afinitor today.    PLAN:  1.  Unfortunately the cutaneous cancerous lesions have progressed.  We will repeat a CT chest abdomen pelvis and refer her to radiation oncology.  We will also likely change her therapy to Faslodex.  For now she will stop taking the Afinitor and continue Aromasin daily.    2. Patient will receive Rivka Barbara today for her bony metastases.    3.  We will see Ms. Sloss back  in two weeks following her CT scans.      All questions were answered. The patient knows to call the clinic with any problems, questions or concerns. We can certainly see the patient much sooner if necessary.  I spent 25 minutes counseling the patient face to face. The total time spent in the appointment was 30 minutes.  Cherie Ouch Lyn Hollingshead, NP Medical Oncology [facility] Phone: [phone] 06/23/2013, 12:39 AM    ATTENDING'S ATTESTATION:  I personally reviewed patient's chart, examined patient myself, formulated the treatment plan as followed.   Patient is having progressive disease with increase in the number of cutaneous cancerous lesions. I have recommended we do CT of the chest abdomen and pelvis and refer her to radiation oncology. Also I have recommended that she begin Faslodex and stop taking Crestor. She will continue her XGeva. We discussed the rationale for all of these Changes. She'll be seen back in 2 weeks' time for followup. All questions were answered today.  Drue Second, MD Medical/Oncology [facility] [phone] (beeper) [phone] (Office)  07/19/2013, 6:24 PM

## 2013-06-22 ENCOUNTER — Other Ambulatory Visit: Payer: Self-pay | Admitting: Adult Health

## 2013-06-28 ENCOUNTER — Other Ambulatory Visit: Payer: Medicare Other | Admitting: Lab

## 2013-06-28 ENCOUNTER — Ambulatory Visit: Payer: Medicare Other | Admitting: Adult Health

## 2013-06-28 ENCOUNTER — Ambulatory Visit: Payer: Medicare Other

## 2013-07-05 ENCOUNTER — Inpatient Hospital Stay (HOSPITAL_COMMUNITY)
Admission: EM | Admit: 2013-07-05 | Discharge: 2013-07-08 | DRG: 186 | Disposition: A | Discharge status: Skilled Nursing Facility | Payer: Medicare Other | Attending: Internal Medicine | Admitting: Internal Medicine

## 2013-07-05 ENCOUNTER — Emergency Department (HOSPITAL_COMMUNITY): Payer: Medicare Other

## 2013-07-05 ENCOUNTER — Encounter (HOSPITAL_COMMUNITY): Payer: Self-pay | Admitting: Emergency Medicine

## 2013-07-05 ENCOUNTER — Ambulatory Visit (HOSPITAL_COMMUNITY)
Admission: RE | Admit: 2013-07-05 | Discharge: 2013-07-05 | Disposition: A | Discharge status: Home / Self Care | Payer: Medicare Other | Source: Ambulatory Visit | Attending: Adult Health | Admitting: Adult Health

## 2013-07-05 DIAGNOSIS — C7951 Secondary malignant neoplasm of bone: Secondary | ICD-10-CM | POA: Diagnosis present

## 2013-07-05 DIAGNOSIS — C50919 Malignant neoplasm of unspecified site of unspecified female breast: Secondary | ICD-10-CM

## 2013-07-05 DIAGNOSIS — R0902 Hypoxemia: Secondary | ICD-10-CM | POA: Diagnosis present

## 2013-07-05 DIAGNOSIS — J9600 Acute respiratory failure, unspecified whether with hypoxia or hypercapnia: Secondary | ICD-10-CM | POA: Diagnosis present

## 2013-07-05 DIAGNOSIS — Z66 Do not resuscitate: Secondary | ICD-10-CM | POA: Diagnosis present

## 2013-07-05 DIAGNOSIS — C50411 Malignant neoplasm of upper-outer quadrant of right female breast: Secondary | ICD-10-CM

## 2013-07-05 DIAGNOSIS — R531 Weakness: Secondary | ICD-10-CM

## 2013-07-05 DIAGNOSIS — M79609 Pain in unspecified limb: Secondary | ICD-10-CM | POA: Diagnosis present

## 2013-07-05 DIAGNOSIS — J90 Pleural effusion, not elsewhere classified: Principal | ICD-10-CM | POA: Diagnosis present

## 2013-07-05 DIAGNOSIS — E872 Acidosis, unspecified: Secondary | ICD-10-CM | POA: Diagnosis present

## 2013-07-05 DIAGNOSIS — F17200 Nicotine dependence, unspecified, uncomplicated: Secondary | ICD-10-CM | POA: Diagnosis present

## 2013-07-05 DIAGNOSIS — E871 Hypo-osmolality and hyponatremia: Secondary | ICD-10-CM | POA: Diagnosis present

## 2013-07-05 DIAGNOSIS — E889 Metabolic disorder, unspecified: Secondary | ICD-10-CM | POA: Diagnosis present

## 2013-07-05 DIAGNOSIS — C50419 Malignant neoplasm of upper-outer quadrant of unspecified female breast: Secondary | ICD-10-CM | POA: Diagnosis present

## 2013-07-05 DIAGNOSIS — R0600 Dyspnea, unspecified: Secondary | ICD-10-CM

## 2013-07-05 DIAGNOSIS — M79602 Pain in left arm: Secondary | ICD-10-CM

## 2013-07-05 DIAGNOSIS — R5381 Other malaise: Secondary | ICD-10-CM | POA: Diagnosis present

## 2013-07-05 DIAGNOSIS — R0602 Shortness of breath: Secondary | ICD-10-CM | POA: Diagnosis present

## 2013-07-05 DIAGNOSIS — C787 Secondary malignant neoplasm of liver and intrahepatic bile duct: Secondary | ICD-10-CM | POA: Diagnosis present

## 2013-07-05 DIAGNOSIS — R627 Adult failure to thrive: Secondary | ICD-10-CM | POA: Diagnosis present

## 2013-07-05 DIAGNOSIS — I129 Hypertensive chronic kidney disease with stage 1 through stage 4 chronic kidney disease, or unspecified chronic kidney disease: Secondary | ICD-10-CM | POA: Diagnosis present

## 2013-07-05 DIAGNOSIS — E119 Type 2 diabetes mellitus without complications: Secondary | ICD-10-CM | POA: Diagnosis present

## 2013-07-05 DIAGNOSIS — Z515 Encounter for palliative care: Secondary | ICD-10-CM

## 2013-07-05 DIAGNOSIS — C50911 Malignant neoplasm of unspecified site of right female breast: Secondary | ICD-10-CM | POA: Diagnosis present

## 2013-07-05 DIAGNOSIS — L03119 Cellulitis of unspecified part of limb: Secondary | ICD-10-CM

## 2013-07-05 DIAGNOSIS — C50912 Malignant neoplasm of unspecified site of left female breast: Secondary | ICD-10-CM

## 2013-07-05 DIAGNOSIS — I890 Lymphedema, not elsewhere classified: Secondary | ICD-10-CM | POA: Diagnosis present

## 2013-07-05 DIAGNOSIS — N189 Chronic kidney disease, unspecified: Secondary | ICD-10-CM | POA: Diagnosis present

## 2013-07-05 DIAGNOSIS — J9811 Atelectasis: Secondary | ICD-10-CM

## 2013-07-05 DIAGNOSIS — Z6836 Body mass index (BMI) 36.0-36.9, adult: Secondary | ICD-10-CM | POA: Diagnosis present

## 2013-07-05 DIAGNOSIS — IMO0002 Reserved for concepts with insufficient information to code with codable children: Secondary | ICD-10-CM | POA: Diagnosis present

## 2013-07-05 HISTORY — DX: Malignant neoplasm of unspecified site of unspecified female breast: C50.919

## 2013-07-05 HISTORY — DX: Hypoxemia: R09.02

## 2013-07-05 LAB — BLOOD GAS, ARTERIAL
Bicarbonate: 19.5 mEq/L — ABNORMAL LOW (ref 20.0–24.0)
Drawn by: 310571
FIO2: 0.21 %
Patient temperature: 98.6
pH, Arterial: 7.345 — ABNORMAL LOW (ref 7.350–7.450)
pO2, Arterial: 54.1 mmHg — ABNORMAL LOW (ref 80.0–100.0)

## 2013-07-05 LAB — CBC WITH DIFFERENTIAL/PLATELET
Basophils Relative: 1 % (ref 0–1)
HCT: 34.7 % — ABNORMAL LOW (ref 36.0–46.0)
Hemoglobin: 11.2 g/dL — ABNORMAL LOW (ref 12.0–15.0)
Lymphs Abs: 0.9 10*3/uL (ref 0.7–4.0)
MCH: 26.5 pg (ref 26.0–34.0)
MCHC: 32.3 g/dL (ref 30.0–36.0)
Monocytes Absolute: 0.8 10*3/uL (ref 0.1–1.0)
Monocytes Relative: 15 % — ABNORMAL HIGH (ref 3–12)
Neutro Abs: 3.4 10*3/uL (ref 1.7–7.7)
RBC: 4.23 MIL/uL (ref 3.87–5.11)

## 2013-07-05 LAB — BASIC METABOLIC PANEL
BUN: 20 mg/dL (ref 6–23)
Chloride: 102 mEq/L (ref 96–112)
Glucose, Bld: 102 mg/dL — ABNORMAL HIGH (ref 70–99)
Potassium: 4.6 mEq/L (ref 3.5–5.1)

## 2013-07-05 MED ORDER — ONDANSETRON HCL 4 MG/2ML IJ SOLN
4.0000 mg | Freq: Four times a day (QID) | INTRAMUSCULAR | Status: DC | PRN
  Administered 2013-07-08: 4 mg via INTRAVENOUS
  Filled 2013-07-05: qty 2

## 2013-07-05 MED ORDER — ALBUTEROL SULFATE HFA 108 (90 BASE) MCG/ACT IN AERS
2.0000 | INHALATION_SPRAY | Freq: Four times a day (QID) | RESPIRATORY_TRACT | Status: DC | PRN
  Filled 2013-07-05: qty 6.7

## 2013-07-05 MED ORDER — EXEMESTANE 25 MG PO TABS
25.0000 mg | ORAL_TABLET | Freq: Every day | ORAL | Status: DC
  Administered 2013-07-06 – 2013-07-08 (×3): 25 mg via ORAL
  Filled 2013-07-05 (×4): qty 1

## 2013-07-05 MED ORDER — HEPARIN SODIUM (PORCINE) 5000 UNIT/ML IJ SOLN
5000.0000 [IU] | Freq: Three times a day (TID) | INTRAMUSCULAR | Status: DC
  Administered 2013-07-05 – 2013-07-08 (×8): 5000 [IU] via SUBCUTANEOUS
  Filled 2013-07-05 (×11): qty 1

## 2013-07-05 MED ORDER — MORPHINE SULFATE 4 MG/ML IJ SOLN
4.0000 mg | INTRAMUSCULAR | Status: DC | PRN
  Administered 2013-07-08: 4 mg via INTRAVENOUS
  Filled 2013-07-05 (×3): qty 1

## 2013-07-05 MED ORDER — SODIUM CHLORIDE 0.9 % IV SOLN
INTRAVENOUS | Status: DC
  Administered 2013-07-05: 75 mL/h via INTRAVENOUS

## 2013-07-05 MED ORDER — IOHEXOL 300 MG/ML  SOLN
50.0000 mL | Freq: Once | INTRAMUSCULAR | Status: AC | PRN
  Administered 2013-07-05: 50 mL via ORAL

## 2013-07-05 MED ORDER — HYDROCODONE-ACETAMINOPHEN 5-325 MG PO TABS
1.0000 | ORAL_TABLET | ORAL | Status: DC | PRN
  Administered 2013-07-05 – 2013-07-08 (×12): 1 via ORAL
  Filled 2013-07-05 (×12): qty 1

## 2013-07-05 MED ORDER — LORAZEPAM 0.5 MG PO TABS
0.5000 mg | ORAL_TABLET | Freq: Four times a day (QID) | ORAL | Status: DC | PRN
  Administered 2013-07-05 – 2013-07-07 (×4): 0.5 mg via ORAL
  Filled 2013-07-05 (×5): qty 1

## 2013-07-05 MED ORDER — SODIUM CHLORIDE 0.9 % IV BOLUS (SEPSIS)
500.0000 mL | Freq: Once | INTRAVENOUS | Status: AC
  Administered 2013-07-05: 500 mL via INTRAVENOUS

## 2013-07-05 MED ORDER — SODIUM CHLORIDE 0.9 % IV SOLN
Freq: Once | INTRAVENOUS | Status: AC
  Administered 2013-07-05: 13:00:00 via INTRAVENOUS

## 2013-07-05 MED ORDER — POLYETHYLENE GLYCOL 3350 17 G PO PACK
17.0000 g | PACK | Freq: Every day | ORAL | Status: DC | PRN
  Filled 2013-07-05: qty 1

## 2013-07-05 MED ORDER — ONDANSETRON HCL 4 MG PO TABS: 4.0000 mg | ORAL_TABLET | Freq: Four times a day (QID) | ORAL | Status: DC | PRN

## 2013-07-05 MED ORDER — DEXTROSE 5 % IV SOLN: 1.0000 g | Freq: Once | INTRAVENOUS | Status: DC

## 2013-07-05 MED ORDER — AMLODIPINE BESYLATE 5 MG PO TABS
5.0000 mg | ORAL_TABLET | Freq: Every day | ORAL | Status: DC
  Administered 2013-07-05 – 2013-07-08 (×4): 5 mg via ORAL
  Filled 2013-07-05 (×4): qty 1

## 2013-07-05 MED ORDER — IOHEXOL 300 MG/ML  SOLN
100.0000 mL | Freq: Once | INTRAMUSCULAR | Status: AC | PRN
  Administered 2013-07-05: 100 mL via INTRAVENOUS

## 2013-07-05 MED ORDER — VANCOMYCIN HCL IN DEXTROSE 750-5 MG/150ML-% IV SOLN
750.0000 mg | INTRAVENOUS | Status: DC
  Administered 2013-07-05: 750 mg via INTRAVENOUS
  Filled 2013-07-05 (×2): qty 150

## 2013-07-05 NOTE — Progress Notes (Signed)
Patient declines MyChart activation-No computer

## 2013-07-05 NOTE — Progress Notes (Signed)
ANTIBIOTIC CONSULT NOTE - INITIAL  Pharmacy Consult for Vancomycin Indication: Cellulitis of the left upper extremity  No Known Allergies  Patient Measurements: Height: 5\' 2"  (157.5 cm) Weight: 200 lb 2.8 oz (90.8 kg) IBW/kg (Calculated) : 50.1  Vital Signs: Temp: 98.2 F (36.8 C) (08/26 0912) Temp src: Oral (08/26 0912) BP: 160/59 mmHg (08/26 0912) Pulse Rate: 114 (08/26 0912) Intake/Output from previous day:   Intake/Output from this shift:    Labs:  Recent Labs  07/05/13 1200  WBC 5.5  HGB 11.2*  PLT 417*  CREATININE 1.70*   Estimated Creatinine Clearance: 27.7 ml/min (by C-G formula based on Cr of 1.7). No results found for this basename: VANCOTROUGH, VANCOPEAK, VANCORANDOM, GENTTROUGH, GENTPEAK, GENTRANDOM, TOBRATROUGH, TOBRAPEAK, TOBRARND, AMIKACINPEAK, AMIKACINTROU, AMIKACIN,  in the last 72 hours   Microbiology: No results found for this or any previous visit (from the past 720 hour(s)).  Medical History: Past Medical History  Diagnosis Date  . Hypertension   . Diabetes mellitus without complication   . Breast cancer     Medications:  Scheduled:  Infusions:  Assessment:  79 yr old female with history of breast cancer  Current H&P not available at this time.  Last oncology office note states stage IV disease with adenopathy and extensive skin involvement  IV Vancomycin per pharmacy for left upper extremity cellulitis ordered  CrCl (CG) ~ 27 ml/min  Goal of Therapy:  Vancomycin trough level 10-15 mcg/ml  Plan:  Measure antibiotic drug levels at steady state Follow up culture results Vancomycin 750mg  IV q24h  Constancia Geeting, Joselyn Glassman, PharmD 07/05/2013,3:32 PM

## 2013-07-05 NOTE — ED Notes (Signed)
Report called to [facility], [place]

## 2013-07-05 NOTE — ED Provider Notes (Signed)
CSN: [id number]     Arrival date & time 07/05/13  0910 History   First MD Initiated Contact with Patient 07/05/13 [phone]     Chief Complaint  Patient presents with  . Not feeling well    (Consider location/radiation/quality/duration/timing/severity/associated sxs/prior Treatment) HPI.....Marland Kitchen level V caveat for urgent need for intervention.  patient has metastatic breast cancer.   She does not feel well.   Specifically no chest pain, dyspnea, dysuria, fever, sweats, chills.    She was scheduled for a CT abdomen pelvis and chest today. Patient arrived via EMS. No family members present. Past Medical History  Diagnosis Date  . Hypertension   . Diabetes mellitus without complication   . Breast cancer    Past Surgical History  Procedure Laterality Date  . Breast surgery     No family history on file. History  Substance Use Topics  . Smoking status: Current Every Day Smoker  . Smokeless tobacco: Never Used  . Alcohol Use: No   OB History   Grav Para Term Preterm Abortions TAB SAB Ect Mult Living                 Review of Systems  Unable to perform ROS: Acuity of condition    Allergies  Review of patient's allergies indicates no known allergies.  Home Medications   Current Outpatient Rx  Name  Route  Sig  Dispense  Refill  . albuterol (PROVENTIL HFA;VENTOLIN HFA) 108 (90 BASE) MCG/ACT inhaler   Inhalation   Inhale 2 puffs into the lungs every 6 (six) hours as needed for wheezing.         Marland Kitchen amLODipine (NORVASC) 5 MG tablet   Oral   Take 5 mg by mouth daily.         Marland Kitchen atorvastatin (LIPITOR) 10 MG tablet   Oral   Take 10 mg by mouth daily.         . cyclobenzaprine (FLEXERIL) 10 MG tablet   Oral   Take 10 mg by mouth 2 (two) times daily as needed for muscle spasms.          Marland Kitchen exemestane (AROMASIN) 25 MG tablet   Oral   Take 25 mg by mouth daily after breakfast.         . glipiZIDE (GLUCOTROL) 10 MG tablet   Oral   Take 10-20 mg by mouth 2 (two) times daily  before a meal. Takes 2 in am and 1 in evening         . HYDROcodone-acetaminophen (NORCO/VICODIN) 5-325 MG per tablet   Oral   Take 1 tablet by mouth every 4 (four) hours as needed.   30 tablet   0   . LORazepam (ATIVAN) 0.5 MG tablet   Oral   Take 1 tablet (0.5 mg total) by mouth every 6 (six) hours as needed for anxiety.   30 tablet   1   . pantoprazole (PROTONIX) 40 MG tablet   Oral   Take 40 mg by mouth daily.         . simvastatin (ZOCOR) 20 MG tablet   Oral   Take 20 mg by mouth every evening.         . traMADol (ULTRAM) 50 MG tablet   Oral   Take 1 tablet (50 mg total) by mouth every 6 (six) hours as needed for pain.   60 tablet   0    BP 160/59  Pulse 114  Temp(Src) 98.2 F (36.8 C) (Oral)  Resp 20  Ht 5\' 2"  (1.575 m)  Wt 200 lb 2.8 oz (90.8 kg)  BMI 36.6 kg/m2  SpO2 97% Physical Exam  Nursing note and vitals reviewed. Constitutional: She is oriented to person, place, and time.  Slight dementia  HENT:  Head: Normocephalic and atraumatic.  Eyes: Conjunctivae and EOM are normal. Pupils are equal, round, and reactive to light.  Neck: Normal range of motion. Neck supple.  Cardiovascular: Normal rate, regular rhythm and normal heart sounds.   Pulmonary/Chest: Effort normal and breath sounds normal.  Abdominal: Soft. Bowel sounds are normal.  Musculoskeletal: Normal range of motion.  Neurological: She is alert and oriented to person, place, and time.  Skin:  Obvious cancerous lesions on chest wall and left arm.  Left arm is edematous, erythematous, tender to palpation  Psychiatric: She has a normal mood and affect.    ED Course  Procedures (including critical care time) Labs Review Labs Reviewed  BASIC METABOLIC PANEL - Abnormal; Notable for the following:    Sodium 132 (*)    CO2 17 (*)    Glucose, Bld 102 (*)    Creatinine, Ser 1.70 (*)    GFR calc non Af Amer 27 (*)    GFR calc Af Amer 32 (*)    All other components within normal limits   CBC WITH DIFFERENTIAL - Abnormal; Notable for the following:    Hemoglobin 11.2 (*)    HCT 34.7 (*)    RDW 17.0 (*)    Platelets 417 (*)    Monocytes Relative 15 (*)    Eosinophils Relative 8 (*)    All other components within normal limits  BLOOD GAS, ARTERIAL - Abnormal; Notable for the following:    pH, Arterial 7.345 (*)    pO2, Arterial 54.1 (*)    Bicarbonate 19.5 (*)    Acid-base deficit 5.1 (*)    All other components within normal limits   Imaging Review Ct Chest W Contrast  07/05/2013   CLINICAL DATA:  Breast cancer. Diabetes. Hypertension. Lymphedema of the left arm. Left pleural effusion.  EXAM: CT CHEST, ABDOMEN, AND PELVIS WITH CONTRAST  TECHNIQUE: Multidetector CT imaging of the chest, abdomen and pelvis was performed following the standard protocol during bolus administration of intravenous contrast.  CONTRAST:  OMNIPAQUE IOHEXOL 300 MG/ML  SOLN  COMPARISON:  Multiple exams, including 03/10/2013  FINDINGS: CT CHEST FINDINGS  Worsened bilateral subcutaneous edema along the thorax and left upper arm. Increased skin nodularity along the right breast suspicious for diffuse tumor infiltration. Increased nodularity in the glandular tissues of the right breast. Dominant right axillary mass 5.1 x 4.3 cm, formerly 4.5 x 3.4 cm.  Nodularity along the left pectoral os musculature. Enhancing lesion along the left anterior deltoid muscle, 1.7 x 2.4 cm on image 7 of series 2.  Worsened thoracic adenopathy. Prevascular node short axis 1 degree 0.9 cm, formerly 1.3 cm by my measurements. Node adjacent to the left pulmonary artery 1.6 cm, formerly 1.3 cm by my measurements. Precarinal node short axis diameter 1.3 cm, formerly 0.7 cm by my measurements.  No current pericardial effusion. Pleural rind of metastatic disease on the left demonstrates increase conspicuity. Moderate left effusion is larger than before. Increased atelectasis particularly in the left lower lobe. Subcutaneous lesions  along the left back as on images 22-27 of series 2 could represent subcutaneous metastatic lesions.  CT ABDOMEN AND PELVIS FINDINGS  1.7 x 1.9 cm complex lesion in segment 3 of the liver, compatible  with metastatic disease and new from recent PET-CT. Hypodense lesion in segment 4 along the falciform ligament is more likely to be due to fatty infiltration.  Spleen and pancreas unremarkable. Gastrohepatic ligament adenopathy, 1.9 cm in short axis (formerly 1.3 cm). Peripancreatic node 2.0 cm in short axis (formerly 1.2 cm). Left periaortic node adjacent to the adrenal gland, 1.9 cm in short axis, stable. Left periaortic node just below the renal arteries, 1.8 cm in short axis, stable. Adenopathy along the root of the mesentery has increased, index node short axis diameter 1.3 cm, formerly 0.9 cm.  Edema from the chest tracks down into the pannus and along the lateral abdominal subcutaneous tissues.  Uterus absent.  Renal cysts noted.  Similar distribution and appearance of destructive left iliac bone lesion with soft tissue component tracking between the iliac bone and sacrum.  IMPRESSION: CT CHEST IMPRESSION  1. Progressive malignancy, with enlarging nodal metastatic disease, worsening rind of left pleural tumor, worsening subcutaneous and cutaneous infiltration of tumor, and new metastatic lesions including the left deltoid muscle. Enlarging left pleural effusion. 2. Progressive edema in the chest and left upper extremity.  CT ABDOMEN AND PELVIS IMPRESSION  1. Progressive metastatic disease, with new liver metastatic lesion, and worsening gastrohepatic ligament, porta hepatis, and mesenteric root adenopathy. 2. Progressive edema in the pannus and along the lateral abdominal wall subcutaneous tissues. 3. Stable destructive lesion of the left iliac bone with extraosseous extension medially.   Electronically Signed   By: Herbie Baltimore   On: 07/05/2013 14:27   Ct Abdomen Pelvis W Contrast  07/05/2013   CLINICAL  DATA:  Breast cancer. Diabetes. Hypertension. Lymphedema of the left arm. Left pleural effusion.  EXAM: CT CHEST, ABDOMEN, AND PELVIS WITH CONTRAST  TECHNIQUE: Multidetector CT imaging of the chest, abdomen and pelvis was performed following the standard protocol during bolus administration of intravenous contrast.  CONTRAST:  OMNIPAQUE IOHEXOL 300 MG/ML  SOLN  COMPARISON:  Multiple exams, including 03/10/2013  FINDINGS: CT CHEST FINDINGS  Worsened bilateral subcutaneous edema along the thorax and left upper arm. Increased skin nodularity along the right breast suspicious for diffuse tumor infiltration. Increased nodularity in the glandular tissues of the right breast. Dominant right axillary mass 5.1 x 4.3 cm, formerly 4.5 x 3.4 cm.  Nodularity along the left pectoral os musculature. Enhancing lesion along the left anterior deltoid muscle, 1.7 x 2.4 cm on image 7 of series 2.  Worsened thoracic adenopathy. Prevascular node short axis 1 degree 0.9 cm, formerly 1.3 cm by my measurements. Node adjacent to the left pulmonary artery 1.6 cm, formerly 1.3 cm by my measurements. Precarinal node short axis diameter 1.3 cm, formerly 0.7 cm by my measurements.  No current pericardial effusion. Pleural rind of metastatic disease on the left demonstrates increase conspicuity. Moderate left effusion is larger than before. Increased atelectasis particularly in the left lower lobe. Subcutaneous lesions along the left back as on images 22-27 of series 2 could represent subcutaneous metastatic lesions.  CT ABDOMEN AND PELVIS FINDINGS  1.7 x 1.9 cm complex lesion in segment 3 of the liver, compatible with metastatic disease and new from recent PET-CT. Hypodense lesion in segment 4 along the falciform ligament is more likely to be due to fatty infiltration.  Spleen and pancreas unremarkable. Gastrohepatic ligament adenopathy, 1.9 cm in short axis (formerly 1.3 cm). Peripancreatic node 2.0 cm in short axis (formerly 1.2 cm). Left  periaortic node adjacent to the adrenal gland, 1.9 cm in short axis, stable. Left  periaortic node just below the renal arteries, 1.8 cm in short axis, stable. Adenopathy along the root of the mesentery has increased, index node short axis diameter 1.3 cm, formerly 0.9 cm.  Edema from the chest tracks down into the pannus and along the lateral abdominal subcutaneous tissues.  Uterus absent.  Renal cysts noted.  Similar distribution and appearance of destructive left iliac bone lesion with soft tissue component tracking between the iliac bone and sacrum.  IMPRESSION: CT CHEST IMPRESSION  1. Progressive malignancy, with enlarging nodal metastatic disease, worsening rind of left pleural tumor, worsening subcutaneous and cutaneous infiltration of tumor, and new metastatic lesions including the left deltoid muscle. Enlarging left pleural effusion. 2. Progressive edema in the chest and left upper extremity.  CT ABDOMEN AND PELVIS IMPRESSION  1. Progressive metastatic disease, with new liver metastatic lesion, and worsening gastrohepatic ligament, porta hepatis, and mesenteric root adenopathy. 2. Progressive edema in the pannus and along the lateral abdominal wall subcutaneous tissues. 3. Stable destructive lesion of the left iliac bone with extraosseous extension medially.   Electronically Signed   By: Herbie Baltimore   On: 07/05/2013 14:27    MDM   1. Cancer of upper-outer quadrant of female breast, right   2. Breast cancer, unspecified laterality    CT scan of abdomen pelvis and chest reveal profound metastatic disease.  IV vancomycin for left arm cellulitis.  Admit to general medicine.    Donnetta Hutching, MD 07/05/13 1606

## 2013-07-05 NOTE — Progress Notes (Addendum)
[facility] ED CM noted CM consult for possible home health services Pt states she had a visit from a female "dressed in a blue and white uniform recently but I don't know where she was from" I live at home with my son, tyrone ("he goes to dialysis") and my husband with "glaucoma", "we take care of each other" Pt gave Cm permission to contact her son, [name].  Cm placed a copy of choices for home health care in a pt belonging bag and placed on her bedside table  [id number] CM made attempt to reach son, tyrone via home number [phone] but no answer  1643 Spoke with tyrone via cell number [phone] to inquire about home health services He reports pt has not been seen by a home health agency but has been visited by [place] health care staff not home health on Thursday June 30 2013.  Reports pt recently d/c from [facility] possibly 2-3 weeks ago. [name] reports pt with cancer, "dementia and schizophrenia" Cm did not note the two diagnosis on pmh list for pt . [name] reports pt has defacated in the kitchen floor x 3, tracked through the home, has poor appetite and is "hard headed and can be evil" He voiced concerns that he does not know if pt "does things on purpose"   He request pt be "checked out mentally" and to be sent to a facility   He reports not being able to care for pt and his father at the same time Reports his father is recovering from recent surgery for prostate cancer.  He confirmed he is a dialysis pt   CM reviewed in details medicare guidelines, home health [facility]) (length of stay in home, types of [facility] staff available, coverage, primary caregiver, up to 24 hrs before services may be started), Private duty nursing (PDN-coverage, and Skilled nursing facilities (snf- coverage and services offered)   Discussed pt to be further evaluated by unit therapists (PT/OT) for recommendation of level of care and share this with attending MD and unit CM   SW consult entered in [place]

## 2013-07-05 NOTE — Progress Notes (Signed)
UR completed 

## 2013-07-05 NOTE — H&P (Addendum)
[facility] History and Physical  [name] HYQ:[id number] DOB: September 20, 1931 DOA: 07/05/2013  Referring physician: Dr. Adriana [name] PCP: [name]  Specialists: [name]  Chief Complaint: generalized weaknes  HPI: [name] is a 79 y.o. female  Past medical history of stage IV breast cancer status post left breast lumpectomy in 2010 with lymph node dissection, biopsy done on March thousand and 14 of the right axillary lymph node showed invasive ductal carcinoma currently on chemotherapy, with a CT scan and 02/19/2011 and 14 this showed extensive adenopathy of the mediastinum that comes in for generalized weakness and shortness of breath that started about a week prior to admission progressively getting worse. There is no family at bedside I have tried to call her son [name] at [phone] with no success, I don't think the patient is reliable, as she does not seem to have enough insight on her condition.  She cannot tell me whether she's had a fever, she can't give any more details about her generalized weakness, but she is complaining of mild nausea that has been going on for 24 hours.  In the ED: CT scan of the chest and abdomen was done that showed metastatic disease, to liver and bone and lymphadenopathy, with the left sided effusion worst compared to previous CTs . a basic metabolic panel was done that showed metabolic acidosis with hyponatremia, and ABG was done that showed a pH of 7.35 PCO2 of 36 and a PO2 of 54 on room air. A CBC showed a normal white count mild anemia tumor consulted for further evaluation.  Review of Systems: The patient denies anorexia, fever, weight loss,, vision loss, decreased hearing, hoarseness, chest pain, on exertion, peripheral edema, balance deficits, hemoptysis, abdominal pain, melena, hematochezia, severe indigestion/heartburn, hematuria, incontinence, genital sores, muscle weakness, suspicious skin lesions, transient blindness,  difficulty walking, depression, abnormal bleeding, enlarged lymph nodes, angioedema, and breast masses.   Past Medical History  Diagnosis Date  . Hypertension   . Diabetes mellitus without complication   . Breast cancer    Past Surgical History  Procedure Laterality Date  . Breast surgery     Social History:  reports that she has been smoking.  She has never used smokeless tobacco. She reports that she does not drink alcohol or use illicit drugs. Lives at home.  No Known Allergies  Family History  Problem Relation Age of Onset  . Other Mother   . Other Father     Prior to Admission medications   Medication Sig Start Date End Date Taking? Authorizing Provider  albuterol (PROVENTIL HFA;VENTOLIN HFA) 108 (90 BASE) MCG/ACT inhaler Inhale 2 puffs into the lungs every 6 (six) hours as needed for wheezing.   Yes Historical Provider, [name]  amLODipine (NORVASC) 5 MG tablet Take 5 mg by mouth daily.   Yes Historical Provider, [name]  atorvastatin (LIPITOR) 10 MG tablet Take 10 mg by mouth daily.   Yes Historical Provider, [name]  cyclobenzaprine (FLEXERIL) 10 MG tablet Take 10 mg by mouth 2 (two) times daily as needed for muscle spasms.    Yes Historical Provider, [name]  exemestane (AROMASIN) 25 MG tablet Take 25 mg by mouth daily after breakfast.   Yes Historical Provider, [name]  glipiZIDE (GLUCOTROL) 10 MG tablet Take 10-20 mg by mouth 2 (two) times daily before a meal. Takes 2 in am and 1 in evening   Yes Historical Provider, [name]  HYDROcodone-acetaminophen (NORCO/VICODIN) 5-325 MG per tablet Take 1 tablet by mouth every 4 (four) hours  as needed. 05/12/13  Yes [name]  LORazepam (ATIVAN) 0.5 MG tablet Take 1 tablet (0.5 mg total) by mouth every 6 (six) hours as needed for anxiety. 02/23/13  Yes Carol Ogle, [name]  pantoprazole (PROTONIX) 40 MG tablet Take 40 mg by mouth daily.   Yes Historical Provider, [name]  simvastatin (ZOCOR) 20 MG tablet Take 20 mg by mouth every evening.   Yes Historical Provider,  [name]  traMADol (ULTRAM) 50 MG tablet Take 1 tablet (50 mg total) by mouth every 6 (six) hours as needed for pain. 04/19/13 04/19/14 Yes [name]   Physical Exam: Filed Vitals:   07/05/13 0912  BP: 160/59  Pulse: 114  Temp: 98.2 F (36.8 C)  Resp: 20    BP 160/59  Pulse 114  Temp(Src) 98.2 F (36.8 C) (Oral)  Resp 20  Ht 5\' 2"  (1.575 m)  Wt 90.8 kg (200 lb 2.8 oz)  BMI 36.6 kg/m2  SpO2 97%  General Appearance:    Alert, cooperative, no distress, appears stated age, disheveled   Head:    Normocephalic, without obvious abnormality, atraumatic           Throat:   Lips, mucosa, and tongue has purpuric lesions         Lungs:    moderate air movement with decreased air sounds on the left   Chest Wall:    her chest wall as the form on the left due to to her mastectomy    Heart:    Regular rate and rhythm, S1 and S2 normal, no murmur, rub   or gallop     Abdomen:     Soft, non-tender, bowel sounds active all four quadrants,    no masses, no organomegaly        Extremities:   her left upper extremity is extremely swollen with multiple metastatic lesion and erythematous and warm   Pulses:   2+ and symmetric all extremities  Skin:   on her chest and left arm is erythematous with what appeared to be metastatic lesions that are hard to touch not tender.  Lymph nodes:   Cervical, supraclavicular, and axillary nodes normal  Neurologic:   CNII-XII intact, normal strength, sensation and reflexes    throughout      Labs on Admission:  Basic Metabolic Panel:  Recent Labs Lab 07/05/13 1200  NA 132*  K 4.6  CL 102  CO2 17*  GLUCOSE 102*  BUN 20  CREATININE 1.70*  CALCIUM 9.3   Liver Function Tests: No results found for this basename: AST, ALT, ALKPHOS, BILITOT, PROT, ALBUMIN,  in the last 168 hours No results found for this basename: LIPASE, AMYLASE,  in the last 168 hours No results found for this basename: AMMONIA,  in the last 168 hours CBC:  Recent Labs Lab  07/05/13 1200  WBC 5.5  NEUTROABS 3.4  HGB 11.2*  HCT 34.7*  MCV 82.0  PLT 417*   Cardiac Enzymes: No results found for this basename: CKTOTAL, CKMB, CKMBINDEX, TROPONINI,  in the last 168 hours  BNP (last 3 results)  Recent Labs  02/17/13 0935  PROBNP 4967.0*   CBG: No results found for this basename: GLUCAP,  in the last 168 hours  Radiological Exams on Admission: Ct Chest W Contrast  07/05/2013   CLINICAL DATA:  Breast cancer. Diabetes. Hypertension. Lymphedema of the left arm. Left pleural effusion.  EXAM: CT CHEST, ABDOMEN, AND PELVIS WITH CONTRAST  TECHNIQUE: Multidetector CT imaging of  the chest, abdomen and pelvis was performed following the standard protocol during bolus administration of intravenous contrast.  CONTRAST:  OMNIPAQUE IOHEXOL 300 MG/ML  SOLN  COMPARISON:  Multiple exams, including 03/10/2013  FINDINGS: CT CHEST FINDINGS  Worsened bilateral subcutaneous edema along the thorax and left upper arm. Increased skin nodularity along the right breast suspicious for diffuse tumor infiltration. Increased nodularity in the glandular tissues of the right breast. Dominant right axillary mass 5.1 x 4.3 cm, formerly 4.5 x 3.4 cm.  Nodularity along the left pectoral os musculature. Enhancing lesion along the left anterior deltoid muscle, 1.7 x 2.4 cm on image 7 of series 2.  Worsened thoracic adenopathy. Prevascular node short axis 1 degree 0.9 cm, formerly 1.3 cm by my measurements. Node adjacent to the left pulmonary artery 1.6 cm, formerly 1.3 cm by my measurements. Precarinal node short axis diameter 1.3 cm, formerly 0.7 cm by my measurements.  No current pericardial effusion. Pleural rind of metastatic disease on the left demonstrates increase conspicuity. Moderate left effusion is larger than before. Increased atelectasis particularly in the left lower lobe. Subcutaneous lesions along the left back as on images 22-27 of series 2 could represent subcutaneous metastatic  lesions.  CT ABDOMEN AND PELVIS FINDINGS  1.7 x 1.9 cm complex lesion in segment 3 of the liver, compatible with metastatic disease and new from recent PET-CT. Hypodense lesion in segment 4 along the falciform ligament is more likely to be due to fatty infiltration.  Spleen and pancreas unremarkable. Gastrohepatic ligament adenopathy, 1.9 cm in short axis (formerly 1.3 cm). Peripancreatic node 2.0 cm in short axis (formerly 1.2 cm). Left periaortic node adjacent to the adrenal gland, 1.9 cm in short axis, stable. Left periaortic node just below the renal arteries, 1.8 cm in short axis, stable. Adenopathy along the root of the mesentery has increased, index node short axis diameter 1.3 cm, formerly 0.9 cm.  Edema from the chest tracks down into the pannus and along the lateral abdominal subcutaneous tissues.  Uterus absent.  Renal cysts noted.  Similar distribution and appearance of destructive left iliac bone lesion with soft tissue component tracking between the iliac bone and sacrum.  IMPRESSION: CT CHEST IMPRESSION  1. Progressive malignancy, with enlarging nodal metastatic disease, worsening rind of left pleural tumor, worsening subcutaneous and cutaneous infiltration of tumor, and new metastatic lesions including the left deltoid muscle. Enlarging left pleural effusion. 2. Progressive edema in the chest and left upper extremity.  CT ABDOMEN AND PELVIS IMPRESSION  1. Progressive metastatic disease, with new liver metastatic lesion, and worsening gastrohepatic ligament, porta hepatis, and mesenteric root adenopathy. 2. Progressive edema in the pannus and along the lateral abdominal wall subcutaneous tissues. 3. Stable destructive lesion of the left iliac bone with extraosseous extension medially.   Electronically Signed   By: Herbie Baltimore   On: 07/05/2013 14:27   Ct Abdomen Pelvis W Contrast  07/05/2013   CLINICAL DATA:  Breast cancer. Diabetes. Hypertension. Lymphedema of the left arm. Left pleural  effusion.  EXAM: CT CHEST, ABDOMEN, AND PELVIS WITH CONTRAST  TECHNIQUE: Multidetector CT imaging of the chest, abdomen and pelvis was performed following the standard protocol during bolus administration of intravenous contrast.  CONTRAST:  OMNIPAQUE IOHEXOL 300 MG/ML  SOLN  COMPARISON:  Multiple exams, including 03/10/2013  FINDINGS: CT CHEST FINDINGS  Worsened bilateral subcutaneous edema along the thorax and left upper arm. Increased skin nodularity along the right breast suspicious for diffuse tumor infiltration. Increased nodularity in the  glandular tissues of the right breast. Dominant right axillary mass 5.1 x 4.3 cm, formerly 4.5 x 3.4 cm.  Nodularity along the left pectoral os musculature. Enhancing lesion along the left anterior deltoid muscle, 1.7 x 2.4 cm on image 7 of series 2.  Worsened thoracic adenopathy. Prevascular node short axis 1 degree 0.9 cm, formerly 1.3 cm by my measurements. Node adjacent to the left pulmonary artery 1.6 cm, formerly 1.3 cm by my measurements. Precarinal node short axis diameter 1.3 cm, formerly 0.7 cm by my measurements.  No current pericardial effusion. Pleural rind of metastatic disease on the left demonstrates increase conspicuity. Moderate left effusion is larger than before. Increased atelectasis particularly in the left lower lobe. Subcutaneous lesions along the left back as on images 22-27 of series 2 could represent subcutaneous metastatic lesions.  CT ABDOMEN AND PELVIS FINDINGS  1.7 x 1.9 cm complex lesion in segment 3 of the liver, compatible with metastatic disease and new from recent PET-CT. Hypodense lesion in segment 4 along the falciform ligament is more likely to be due to fatty infiltration.  Spleen and pancreas unremarkable. Gastrohepatic ligament adenopathy, 1.9 cm in short axis (formerly 1.3 cm). Peripancreatic node 2.0 cm in short axis (formerly 1.2 cm). Left periaortic node adjacent to the adrenal gland, 1.9 cm in short axis, stable. Left  periaortic node just below the renal arteries, 1.8 cm in short axis, stable. Adenopathy along the root of the mesentery has increased, index node short axis diameter 1.3 cm, formerly 0.9 cm.  Edema from the chest tracks down into the pannus and along the lateral abdominal subcutaneous tissues.  Uterus absent.  Renal cysts noted.  Similar distribution and appearance of destructive left iliac bone lesion with soft tissue component tracking between the iliac bone and sacrum.  IMPRESSION: CT CHEST IMPRESSION  1. Progressive malignancy, with enlarging nodal metastatic disease, worsening rind of left pleural tumor, worsening subcutaneous and cutaneous infiltration of tumor, and new metastatic lesions including the left deltoid muscle. Enlarging left pleural effusion. 2. Progressive edema in the chest and left upper extremity.  CT ABDOMEN AND PELVIS IMPRESSION  1. Progressive metastatic disease, with new liver metastatic lesion, and worsening gastrohepatic ligament, porta hepatis, and mesenteric root adenopathy. 2. Progressive edema in the pannus and along the lateral abdominal wall subcutaneous tissues. 3. Stable destructive lesion of the left iliac bone with extraosseous extension medially.   Electronically Signed   By: Herbie Baltimore   On: 07/05/2013 14:27    EKG: Independently reviewed. none  Assessment/Plan  Cellulitis, upper arm - There is some erythema, I will it and start her on empiric vancomycin. We'll get blood cultures x2. - Probably her lymphedema on her upper extremity is contributing to this episode of cellulitis versus lymphedema causing the erythema that were seen which makes more sense as she doesn't have leukocytosis or differential. And she relates no fevers. - She is mildly tachycardic but her blood pressure is stable.  Anion gap Metabolic acidosis/ Hypoxia/Pleural effusion on left: -  She has a mild metabolic disorder, she does have chronic kidney disease which could be contributing to  her metabolic acidosis. She is mildly hyponatremic and is complaining of being thirsty. We'll go ahead and start her on IV fluids. - Check lactic acid, monitor strict I.'s and O's. - I will ask I are IR to try to get a thoracocentesis of that fluid to see if that improves her hypoxia. Her ABG shows a PO2 of 57 on room air which  can be contributing to her generalized weakness that he's complaining of. - Continue 2 L of oxygen and monitor saturations.  Breast cancer, stage 4/ Breast cancer, right breast/  Lymphedema, left arm - Is quite sad that she has this degree of metastatic cancer with no hospice involvement. Have discussed this with the patient I will consult hospice and palliative care. Also get her oncologist involved.   Ethics: - She appears disheveled, she knows she has cancer she knows is not curable, yet hospice has not been involved, as the extend of her disease is impressive. It would be good to get case manager and social worker are involved along with hospice. To determine the proper placement for this patient.  Code Status: full Family Communication: [name] (son) number as above son (have not been successfull in getting him) Disposition Plan: inpatient  Time spent: Start time 3:10 PM end time 5:01 PM  Marinda Elk [facility] Pager [phone]  If 7PM-7AM, please contact night-coverage [email or web address] Password [facility] 07/05/2013, 4:45 PM

## 2013-07-05 NOTE — ED Notes (Signed)
Patient transported to CT 

## 2013-07-05 NOTE — ED Notes (Signed)
Bed: WA09 Expected date:  Expected time:  Means of arrival:  Comments: CA pt, not feeling well

## 2013-07-05 NOTE — ED Notes (Signed)
Per EMS: PT states she does not feel well. Family states she does this every time she has an appointment to go to.

## 2013-07-06 ENCOUNTER — Inpatient Hospital Stay (HOSPITAL_COMMUNITY): Payer: Medicare Other

## 2013-07-06 DIAGNOSIS — R0902 Hypoxemia: Secondary | ICD-10-CM

## 2013-07-06 DIAGNOSIS — C50419 Malignant neoplasm of upper-outer quadrant of unspecified female breast: Secondary | ICD-10-CM

## 2013-07-06 DIAGNOSIS — I890 Lymphedema, not elsewhere classified: Secondary | ICD-10-CM

## 2013-07-06 LAB — CBC
HCT: 33.9 % — ABNORMAL LOW (ref 36.0–46.0)
Hemoglobin: 11 g/dL — ABNORMAL LOW (ref 12.0–15.0)
MCV: 81.7 fL (ref 78.0–100.0)
RBC: 4.15 MIL/uL (ref 3.87–5.11)
WBC: 5.3 10*3/uL (ref 4.0–10.5)

## 2013-07-06 LAB — COMPREHENSIVE METABOLIC PANEL
ALT: 5 U/L (ref 0–35)
Albumin: 2.2 g/dL — ABNORMAL LOW (ref 3.5–5.2)
BUN: 18 mg/dL (ref 6–23)
CO2: 18 mEq/L — ABNORMAL LOW (ref 19–32)
Chloride: 103 mEq/L (ref 96–112)
Creatinine, Ser: 1.43 mg/dL — ABNORMAL HIGH (ref 0.50–1.10)
GFR calc Af Amer: 39 mL/min — ABNORMAL LOW (ref >90)
Potassium: 5 mEq/L (ref 3.5–5.1)
Total Protein: 6.4 g/dL (ref 6.0–8.3)

## 2013-07-06 NOTE — Evaluation (Signed)
Physical Therapy Evaluation Patient Details Name: [name] MRN: [id number] DOB: 1931/03/05 Today's Date: 07/06/2013 Time: 4098-1191 PT Time Calculation (min): 11 min  PT Assessment / Plan / Recommendation History of Present Illness  79 yo female admitted with L UE cellulits. Past medical history of stage IV breast cancer status post left breast lumpectomy in 2010 with lymph node dissection, biopsy done on March thousand and 14 of the right axillary lymph node showed invasive ductal carcinoma currently on chemotherapy, with a CT scan and 02/19/2011 and 14 this showed extensive adenopathy of the mediastinum that comes in for generalized weakness and shortness of breath that started about a week prior to admission progressively getting worse.   Clinical Impression  On eval, pt required Mod assist for mobility-able to stand and take a few steps in room. Limited eval due to L UE pain-8/10 during session. RN made aware and pain meds given during session. Recommend SNF for ST rehab.     PT Assessment  Patient needs continued PT services    Follow Up Recommendations  SNF    Does the patient have the potential to tolerate intense rehabilitation      Barriers to Discharge        Equipment Recommendations  None recommended by PT    Recommendations for Other Services OT consult   Frequency Min 3X/week    Precautions / Restrictions Precautions Precautions: Fall Precaution Comments: L UE lymphedema and restricted extremity Restrictions Weight Bearing Restrictions: No   Pertinent Vitals/Pain L UE 8/10-RN made aware-meds given during session      Mobility  Bed Mobility Bed Mobility: Supine to Sit;Sit to Supine Supine to Sit: 3: Mod assist Sit to Supine: 3: Mod assist Details for Bed Mobility Assistance: Assist for trunk and bil LEs. Increased time. Little to no use of L UE due to pain.  Transfers Transfers: Sit to Stand;Stand to Sit Sit to Stand: 3: Mod assist;From bed Stand  to Sit: 3: Mod assist;To bed Details for Transfer Assistance: Assist to rise, stabilize, control descent.  Ambulation/Gait Ambulation/Gait Assistance: 3: Mod assist Ambulation/Gait Assistance Details: 4-5 side steps towards [facility] with 1 HHA.     Exercises     PT Diagnosis: Difficulty walking;Generalized weakness;Acute pain  PT Problem List: Decreased strength;Decreased activity tolerance;Decreased balance;Decreased mobility;Pain;Obesity PT Treatment Interventions: DME instruction;Gait training;Functional mobility training;Therapeutic activities;Therapeutic exercise;Patient/family education;Balance training     PT Goals(Current goals can be found in the care plan section) Acute Rehab PT Goals Patient Stated Goal: less pain L UE PT Goal Formulation: With patient Time For Goal Achievement: 07/20/13 Potential to Achieve Goals: Good  Visit Information  Last PT Received On: 07/06/13 Assistance Needed: +2 (safety) History of Present Illness: 79 yo female admitted with L UE cellulits. Past medical history of stage IV breast cancer status post left breast lumpectomy in 2010 with lymph node dissection, biopsy done on March thousand and 14 of the right axillary lymph node showed invasive ductal carcinoma currently on chemotherapy, with a CT scan and 02/19/2011 and 14 this showed extensive adenopathy of the mediastinum that comes in for generalized weakness and shortness of breath that started about a week prior to admission progressively getting worse.        Prior Functioning  Home Living Family/patient expects to be discharged to:: Private residence Living Arrangements: Spouse/significant other;Children Available Help at Discharge: Family;Available 24 hours/day Type of Home: House Home Access: Stairs to enter Entrance Stairs-Rails: Right Home Layout: One level Home Equipment: Walker - 2 wheels;Wheelchair -  manual;Cane - single point Communication Communication: No difficulties     Cognition  Cognition Arousal/Alertness: Awake/alert Behavior During Therapy: WFL for tasks assessed/performed Overall Cognitive Status: Within Functional Limits for tasks assessed    Extremity/Trunk Assessment Upper Extremity Assessment Upper Extremity Assessment: Defer to OT evaluation Lower Extremity Assessment Lower Extremity Assessment: Generalized weakness Cervical / Trunk Assessment Cervical / Trunk Assessment: Normal   Balance Balance Balance Assessed: Yes Static Standing Balance Static Standing - Balance Support: Right upper extremity supported Static Standing - Level of Assistance: 4: Min assist Dynamic Standing Balance Dynamic Standing - Balance Support: Right upper extremity supported Dynamic Standing - Level of Assistance: 3: Mod assist  End of Session PT - End of Session Equipment Utilized During Treatment: Gait belt Activity Tolerance: Patient limited by pain Patient left: in bed;with call bell/phone within reach  GP     Rebeca Alert, MPT Pager: [phone]

## 2013-07-06 NOTE — Procedures (Signed)
Successful US guided left thoracentesis. Yielded 500cc of serous fluid. Pt tolerated procedure well. No immediate complications.  Specimen was not sent for labs. CXR ordered.  Pattricia Boss D PA-C 07/06/2013 10:54 AM

## 2013-07-06 NOTE — Progress Notes (Signed)
Thank you for consulting the Palliative Medicine Team at [facility] to meet your patient's and family's needs.   The reason that you asked [place] to see your patient is  For  Clarification of GOC and options  We have scheduled your patient for a meeting:  07-07-13 at 1300 with Lorinda Creed NP  The Surrogate decision make is: [name] Loveland/son Contact information:[phone]   Your patient is able/unable to participate:able  Additional Narrative: Discussed above with Augustin Schooling Np with oncology, she will get message to Dr Welton Flakes.  Dis not discuss directed with Dr Welton Flakes in that she was in new patient clinic this afternoon.  Will attempt to contact before scheduled meeting.   Lorinda Creed NP  Palliative Medicine Team Team Phone # [phone] Pager [phone]

## 2013-07-06 NOTE — Progress Notes (Signed)
[facility] PROGRESS NOTE  [name] EAV:[id number] DOB: 09-Nov-1931 DOA: 07/05/2013 PCP: [name], MD  Assessment/Plan: Acute Hypoxemic Respiratory Failure/Left Pleural Effusion -Sats improved after therapeutic thoracentesis. -This has been a recurrent issue for her, and if needs frequent thoracentesis may benefit form placement of a pleurex catheter. -Continue to wean oxygen to off if tolerates.  ?Left Arm Cellulitis -She has chronic lymphedema. -With absence of fever, leukocytosis, erythema do not believe she is displaying signs of active infection and will elect to discontinue antibiotic coverage at this time.   Stage IV Breast Cancer -Palliative care consult has been requested. -Awaiting onc input as well.  Code Status: Full Code Family Communication: Patient only  Disposition Plan: SNF placement in process   Consultants:  Palliative Care   Antibiotics:  None   Subjective: Feels much better today, less SOB, Still weak, chronic left arm pain is present.  Objective: Filed Vitals:   07/06/13 1050 07/06/13 1150 07/06/13 1224 07/06/13 1400  BP: 121/63 145/60 140/63 146/58  Pulse:  122 123 114  Temp:  98 F (36.7 C) 98.8 F (37.1 C) 98.8 F (37.1 C)  TempSrc:  Oral Oral Oral  Resp:  18  18  Height:      Weight:      SpO2:  99% 97% 99%    Intake/Output Summary (Last 24 hours) at 07/06/13 1528 Last data filed at 07/06/13 1400  Gross per 24 hour  Intake 1952.5 ml  Output    200 ml  Net 1752.5 ml   Filed Weights   07/05/13 1527  Weight: 90.8 kg (200 lb 2.8 oz)    Exam:   General:  AA Ox3  Cardiovascular: RRR, S4 gallop  Respiratory: CTA B  Abdomen: obese/S/NT/ND/+BS  Extremities: 1-2+ edema LE, 3++ LUE   Neurologic:  Grossly intact and non-focal.  Data Reviewed: Basic Metabolic Panel:  Recent Labs Lab 07/05/13 1200 07/06/13 0610  NA 132* 134*  K 4.6 5.0  CL 102 103  CO2 17* 18*  GLUCOSE 102* 111*  BUN 20 18   CREATININE 1.70* 1.43*  CALCIUM 9.3 8.6   Liver Function Tests:  Recent Labs Lab 07/06/13 0610  AST 25  ALT 5  ALKPHOS 82  BILITOT 0.2*  PROT 6.4  ALBUMIN 2.2*   No results found for this basename: LIPASE, AMYLASE,  in the last 168 hours No results found for this basename: AMMONIA,  in the last 168 hours CBC:  Recent Labs Lab 07/05/13 1200 07/06/13 0422  WBC 5.5 5.3  NEUTROABS 3.4  --   HGB 11.2* 11.0*  HCT 34.7* 33.9*  MCV 82.0 81.7  PLT 417* 394   Cardiac Enzymes: No results found for this basename: CKTOTAL, CKMB, CKMBINDEX, TROPONINI,  in the last 168 hours BNP (last 3 results)  Recent Labs  02/17/13 0935  PROBNP 4967.0*   CBG: No results found for this basename: GLUCAP,  in the last 168 hours  No results found for this or any previous visit (from the past 240 hour(s)).   Studies: Dg Chest 1 View  07/06/2013   *RADIOLOGY REPORT*  Clinical Data: Post left thoracentesis.  CHEST - 1 VIEW  Comparison: CT chest 07/05/2013 and chest radiograph 02/20/2013.  Findings: Trachea is midline.  Heart size stable.  No definite pneumothorax after left thoracentesis. Pleural parenchymal opacity in the left hemithorax is better evaluated on 07/05/2013.  Right lung is grossly clear.  Small left pleural effusion.  Surgical clips in the left axilla.  IMPRESSION:  1.  No definite pneumothorax after left thoracentesis.  Small residual left pleural effusion. 2.  Mediastinal adenopathy and left lung air space disease are better evaluated on 07/05/2013.   Original Report Authenticated By: Leanna Battles, M.D.   Ct Chest W Contrast  07/05/2013   CLINICAL DATA:  Breast cancer. Diabetes. Hypertension. Lymphedema of the left arm. Left pleural effusion.  EXAM: CT CHEST, ABDOMEN, AND PELVIS WITH CONTRAST  TECHNIQUE: Multidetector CT imaging of the chest, abdomen and pelvis was performed following the standard protocol during bolus administration of intravenous contrast.  CONTRAST:   OMNIPAQUE IOHEXOL 300 MG/ML  SOLN  COMPARISON:  Multiple exams, including 03/10/2013  FINDINGS: CT CHEST FINDINGS  Worsened bilateral subcutaneous edema along the thorax and left upper arm. Increased skin nodularity along the right breast suspicious for diffuse tumor infiltration. Increased nodularity in the glandular tissues of the right breast. Dominant right axillary mass 5.1 x 4.3 cm, formerly 4.5 x 3.4 cm.  Nodularity along the left pectoral os musculature. Enhancing lesion along the left anterior deltoid muscle, 1.7 x 2.4 cm on image 7 of series 2.  Worsened thoracic adenopathy. Prevascular node short axis 1 degree 0.9 cm, formerly 1.3 cm by my measurements. Node adjacent to the left pulmonary artery 1.6 cm, formerly 1.3 cm by my measurements. Precarinal node short axis diameter 1.3 cm, formerly 0.7 cm by my measurements.  No current pericardial effusion. Pleural rind of metastatic disease on the left demonstrates increase conspicuity. Moderate left effusion is larger than before. Increased atelectasis particularly in the left lower lobe. Subcutaneous lesions along the left back as on images 22-27 of series 2 could represent subcutaneous metastatic lesions.  CT ABDOMEN AND PELVIS FINDINGS  1.7 x 1.9 cm complex lesion in segment 3 of the liver, compatible with metastatic disease and new from recent PET-CT. Hypodense lesion in segment 4 along the falciform ligament is more likely to be due to fatty infiltration.  Spleen and pancreas unremarkable. Gastrohepatic ligament adenopathy, 1.9 cm in short axis (formerly 1.3 cm). Peripancreatic node 2.0 cm in short axis (formerly 1.2 cm). Left periaortic node adjacent to the adrenal gland, 1.9 cm in short axis, stable. Left periaortic node just below the renal arteries, 1.8 cm in short axis, stable. Adenopathy along the root of the mesentery has increased, index node short axis diameter 1.3 cm, formerly 0.9 cm.  Edema from the chest tracks down into the pannus and along  the lateral abdominal subcutaneous tissues.  Uterus absent.  Renal cysts noted.  Similar distribution and appearance of destructive left iliac bone lesion with soft tissue component tracking between the iliac bone and sacrum.  IMPRESSION: CT CHEST IMPRESSION  1. Progressive malignancy, with enlarging nodal metastatic disease, worsening rind of left pleural tumor, worsening subcutaneous and cutaneous infiltration of tumor, and new metastatic lesions including the left deltoid muscle. Enlarging left pleural effusion. 2. Progressive edema in the chest and left upper extremity.  CT ABDOMEN AND PELVIS IMPRESSION  1. Progressive metastatic disease, with new liver metastatic lesion, and worsening gastrohepatic ligament, porta hepatis, and mesenteric root adenopathy. 2. Progressive edema in the pannus and along the lateral abdominal wall subcutaneous tissues. 3. Stable destructive lesion of the left iliac bone with extraosseous extension medially.   Electronically Signed   By: Herbie Baltimore   On: 07/05/2013 14:27   Ct Abdomen Pelvis W Contrast  07/05/2013   CLINICAL DATA:  Breast cancer. Diabetes. Hypertension. Lymphedema of the left arm. Left pleural effusion.  EXAM: CT CHEST,  ABDOMEN, AND PELVIS WITH CONTRAST  TECHNIQUE: Multidetector CT imaging of the chest, abdomen and pelvis was performed following the standard protocol during bolus administration of intravenous contrast.  CONTRAST:  OMNIPAQUE IOHEXOL 300 MG/ML  SOLN  COMPARISON:  Multiple exams, including 03/10/2013  FINDINGS: CT CHEST FINDINGS  Worsened bilateral subcutaneous edema along the thorax and left upper arm. Increased skin nodularity along the right breast suspicious for diffuse tumor infiltration. Increased nodularity in the glandular tissues of the right breast. Dominant right axillary mass 5.1 x 4.3 cm, formerly 4.5 x 3.4 cm.  Nodularity along the left pectoral os musculature. Enhancing lesion along the left anterior deltoid muscle, 1.7 x 2.4  cm on image 7 of series 2.  Worsened thoracic adenopathy. Prevascular node short axis 1 degree 0.9 cm, formerly 1.3 cm by my measurements. Node adjacent to the left pulmonary artery 1.6 cm, formerly 1.3 cm by my measurements. Precarinal node short axis diameter 1.3 cm, formerly 0.7 cm by my measurements.  No current pericardial effusion. Pleural rind of metastatic disease on the left demonstrates increase conspicuity. Moderate left effusion is larger than before. Increased atelectasis particularly in the left lower lobe. Subcutaneous lesions along the left back as on images 22-27 of series 2 could represent subcutaneous metastatic lesions.  CT ABDOMEN AND PELVIS FINDINGS  1.7 x 1.9 cm complex lesion in segment 3 of the liver, compatible with metastatic disease and new from recent PET-CT. Hypodense lesion in segment 4 along the falciform ligament is more likely to be due to fatty infiltration.  Spleen and pancreas unremarkable. Gastrohepatic ligament adenopathy, 1.9 cm in short axis (formerly 1.3 cm). Peripancreatic node 2.0 cm in short axis (formerly 1.2 cm). Left periaortic node adjacent to the adrenal gland, 1.9 cm in short axis, stable. Left periaortic node just below the renal arteries, 1.8 cm in short axis, stable. Adenopathy along the root of the mesentery has increased, index node short axis diameter 1.3 cm, formerly 0.9 cm.  Edema from the chest tracks down into the pannus and along the lateral abdominal subcutaneous tissues.  Uterus absent.  Renal cysts noted.  Similar distribution and appearance of destructive left iliac bone lesion with soft tissue component tracking between the iliac bone and sacrum.  IMPRESSION: CT CHEST IMPRESSION  1. Progressive malignancy, with enlarging nodal metastatic disease, worsening rind of left pleural tumor, worsening subcutaneous and cutaneous infiltration of tumor, and new metastatic lesions including the left deltoid muscle. Enlarging left pleural effusion. 2. Progressive  edema in the chest and left upper extremity.  CT ABDOMEN AND PELVIS IMPRESSION  1. Progressive metastatic disease, with new liver metastatic lesion, and worsening gastrohepatic ligament, porta hepatis, and mesenteric root adenopathy. 2. Progressive edema in the pannus and along the lateral abdominal wall subcutaneous tissues. 3. Stable destructive lesion of the left iliac bone with extraosseous extension medially.   Electronically Signed   By: Herbie Baltimore   On: 07/05/2013 14:27   Dg Chest Port 1 View  07/06/2013   *RADIOLOGY REPORT*  Clinical Data: Post left thoracentesis.  PORTABLE CHEST - 1 VIEW  Comparison: 07/06/2013 at 1104 hours and CT chest 07/05/2013.  Findings: Trachea is midline.  Heart size stable.  Pulmonary hila appear prominent.  Difficult to exclude trace left apical pneumothorax.  Small left pleural effusion.  Mild diffuse interstitial prominence and indistinctness.  Left basilar airspace disease is unchanged.  IMPRESSION:  1.  Difficult to exclude trace left apical pneumothorax. 2.  Small left pleural effusion. 3.  Suspect  developing edema. 4.  Left basilar airspace disease, as before. 5.  Prominent pulmonary hila, better evaluated on 07/05/2013.   Original Report Authenticated By: Leanna Battles, M.D.   US Thoracentesis Asp Pleural Space W/img Guide  07/06/2013   *RADIOLOGY REPORT*  Clinical Data:  Left sided pleural effusion, breast cancer, CKD  ULTRASOUND GUIDED LEFT THORACENTESIS  An ultrasound guided thoracentesis was thoroughly discussed with the patient and questions answered.  The benefits, risks, alternatives and complications were also discussed.  The patient understands and wishes to proceed with the procedure.  Written consent was obtained.  Ultrasound was performed to localize and mark an adequate pocket of fluid in the left chest.  The area was then prepped and draped in the normal sterile fashion.  1% Lidocaine was used for local anesthesia.  Under ultrasound guidance a 19  gauge Yueh catheter was introduced.  Thoracentesis was performed.  The catheter was removed and a dressing applied.  Complications:  none  Findings: A total of approximately 500cc of serous fluid was removed. A fluid sample was not  sent for laboratory analysis.  IMPRESSION: Successful ultrasound guided left thoracentesis yielding 500cc of pleural fluid.  Read By: Pattricia Boss PA-C   Original Report Authenticated By: Malachy Moan, M.D.    Scheduled Meds: . amLODipine  5 mg Oral Daily  . exemestane  25 mg Oral QPC breakfast  . heparin  5,000 Units Subcutaneous Q8H   Continuous Infusions: . sodium chloride 75 mL/hr (07/05/13 2240)    Principal Problem:   Weakness generalized Active Problems:   Breast cancer, right breast   Lymphedema, left arm   Pleural effusion on left   Metabolic acidosis   Breast cancer, stage 4   Hypoxia    Time spent: 35 minutes.    Chaya Jan  [facility] Pager [phone]  If 7PM-7AM, please contact night-coverage at [email or web address], password [facility] 07/06/2013, 3:28 PM  LOS: 1 day

## 2013-07-06 NOTE — Progress Notes (Signed)
Clinical Social Work Department BRIEF PSYCHOSOCIAL ASSESSMENT 07/06/2013  Patient:  [name], [name]     Account Number:  [id number]     Admit date:  07/05/2013  Clinical Social Worker:  Candie Chroman  Date/Time:  07/06/2013 12:24 PM  Referred by:  Physician  Date Referred:  07/06/2013 Referred for  SNF Placement   Other Referral:   Interview type:  Patient Other interview type:    PSYCHOSOCIAL DATA Living Status:  FAMILY Admitted from facility:   Level of care:   Primary support name:  Dimas Chyle Primary support relationship to patient:  CHILD, ADULT Degree of support available:   ED RNCM notes reviewed. Pt's son appreared to be overwhelmed with care giving responsibilities for both his parents. Notes indicate that son would like pt to go to a facility following hospital d/c.    CURRENT CONCERNS Current Concerns  Post-Acute Placement   Other Concerns:    SOCIAL WORK ASSESSMENT / PLAN Pt is an 79 yr old female living at home prior to hospitalization. CSW met with pt to offer support and assist with d/c planning. Pt states that she has cancer. Palliative Care consult pending. PT has evaluated and has recommended SNF placement . Pt states she has gone to rehab in the past [facility]) and would consider this option again if necessary. Awaiting recommendations from Palliative care. CSW will follow to assist with d/c planning needs.   Assessment/plan status:  Psychosocial Support/Ongoing Assessment of Needs Other assessment/ plan:   Information/referral to community resources:   To be provided once d/c needs have been determined.    PATIENT'S/FAMILY'S RESPONSE TO PLAN OF CARE: No d/c plan is place at this time. Pt would like to return home but will consider SNF. Will meet with pt / contact son following Palliative care recommendations to continue assisting with d/c planning.   Cori Razor LCSW [phone]

## 2013-07-07 ENCOUNTER — Other Ambulatory Visit: Payer: Medicare Other | Admitting: Lab

## 2013-07-07 ENCOUNTER — Ambulatory Visit: Payer: Medicare Other | Admitting: Oncology

## 2013-07-07 ENCOUNTER — Ambulatory Visit: Payer: Medicare Other

## 2013-07-07 DIAGNOSIS — M79602 Pain in left arm: Secondary | ICD-10-CM

## 2013-07-07 DIAGNOSIS — Z515 Encounter for palliative care: Secondary | ICD-10-CM

## 2013-07-07 DIAGNOSIS — M79609 Pain in unspecified limb: Secondary | ICD-10-CM

## 2013-07-07 LAB — CBC
HCT: 35.8 % — ABNORMAL LOW (ref 36.0–46.0)
MCV: 82.1 fL (ref 78.0–100.0)
RBC: 4.36 MIL/uL (ref 3.87–5.11)
WBC: 5.2 10*3/uL (ref 4.0–10.5)

## 2013-07-07 LAB — BASIC METABOLIC PANEL
CO2: 18 mEq/L — ABNORMAL LOW (ref 19–32)
Chloride: 102 mEq/L (ref 96–112)
Creatinine, Ser: 1.28 mg/dL — ABNORMAL HIGH (ref 0.50–1.10)

## 2013-07-07 LAB — GLUCOSE, CAPILLARY

## 2013-07-07 MED ORDER — BISACODYL 10 MG RE SUPP: 10.0000 mg | Freq: Every day | RECTAL | Status: DC | PRN

## 2013-07-07 NOTE — Care Management Note (Signed)
    Page 1 of 1   07/07/2013     11:09:15 AM   CARE MANAGEMENT NOTE 07/07/2013  Patient:  [name], [name]   Account Number:  [id number]  Date Initiated:  07/06/2013  Documentation initiated by:  Lorenda Ishihara  Subjective/Objective Assessment:   79 yo female admitted with L UE cellulits, pmh stage IV breast cancer status post left breast lumpectomy in 2010 with lymph node dissection. PTA lived at home     Action/Plan:   Likely need SNF vs Hospice Facility   Anticipated DC Date:  07/09/2013   Anticipated DC Plan:  SKILLED NURSING FACILITY  In-house referral  Clinical Social Worker  Hospice / Palliative Care      DC Planning Services  CM consult      Choice offered to / List presented to:             Status of service:  Completed, signed off Medicare Important Message given?   (If response is "NO", the following Medicare IM given date fields will be blank) Date Medicare IM given:   Date Additional Medicare IM given:    Discharge Disposition:  SKILLED NURSING FACILITY  Per UR Regulation:  Reviewed for med. necessity/level of care/duration of stay  If discussed at Long Length of Stay Meetings, dates discussed:    Comments:

## 2013-07-07 NOTE — Progress Notes (Signed)
[facility] PROGRESS NOTE  [name] UJW:[id number] DOB: Apr 03, 1931 DOA: 07/05/2013 PCP: [name], MD  Assessment/Plan: Acute Hypoxemic Respiratory Failure/Left Pleural Effusion -Sats improved after therapeutic thoracentesis. -This has been a recurrent issue for her, and if needs frequent thoracentesis may benefit form placement of a pleurex catheter. -Continue to wean oxygen to off if tolerates.  ?Left Arm Cellulitis -She has chronic lymphedema. -With absence of fever, leukocytosis, erythema do not believe she is displaying signs of active infection and will elect to discontinue antibiotic coverage at this time.   Stage IV Breast Cancer -Palliative care consult has been requested. -Formal consult pending but patient and family have elected to not continue treatment and to proceed to SNF with palliative care following.  Code Status: Full Code Family Communication: Patient only  Disposition Plan: SNF placement in process. Likely DC SNF in am if bed available.   Consultants:  Palliative Care   Antibiotics:  None   Subjective: Feels much better today, less SOB, Still weak, chronic left arm pain is present.  Objective: Filed Vitals:   07/06/13 2149 07/07/13 0542 07/07/13 1006 07/07/13 1336  BP: 169/72 148/77 147/79 150/76  Pulse: 115 107 116 115  Temp: 98.1 F (36.7 C) 98.5 F (36.9 C) 98.1 F (36.7 C) 98.2 F (36.8 C)  TempSrc: Oral Oral Oral Oral  Resp: 18 18  18   Height:      Weight:      SpO2: 97% 93% 94% 90%    Intake/Output Summary (Last 24 hours) at 07/07/13 1437 Last data filed at 07/07/13 0820  Gross per 24 hour  Intake    720 ml  Output      0 ml  Net    720 ml   Filed Weights   07/05/13 1527  Weight: 90.8 kg (200 lb 2.8 oz)    Exam:   General:  AA Ox3  Cardiovascular: RRR, S4 gallop  Respiratory: CTA B  Abdomen: obese/S/NT/ND/+BS  Extremities: 1-2+ edema LE, 3++ LUE   Neurologic:  Grossly intact and  non-focal.  Data Reviewed: Basic Metabolic Panel:  Recent Labs Lab 07/05/13 1200 07/06/13 0610 07/07/13 0445  NA 132* 134* 132*  K 4.6 5.0 5.0  CL 102 103 102  CO2 17* 18* 18*  GLUCOSE 102* 111* 133*  BUN 20 18 18   CREATININE 1.70* 1.43* 1.28*  CALCIUM 9.3 8.6 8.8   Liver Function Tests:  Recent Labs Lab 07/06/13 0610  AST 25  ALT 5  ALKPHOS 82  BILITOT 0.2*  PROT 6.4  ALBUMIN 2.2*   No results found for this basename: LIPASE, AMYLASE,  in the last 168 hours No results found for this basename: AMMONIA,  in the last 168 hours CBC:  Recent Labs Lab 07/05/13 1200 07/06/13 0422 07/07/13 0445  WBC 5.5 5.3 5.2  NEUTROABS 3.4  --   --   HGB 11.2* 11.0* 11.7*  HCT 34.7* 33.9* 35.8*  MCV 82.0 81.7 82.1  PLT 417* 394 353   Cardiac Enzymes: No results found for this basename: CKTOTAL, CKMB, CKMBINDEX, TROPONINI,  in the last 168 hours BNP (last 3 results)  Recent Labs  02/17/13 0935  PROBNP 4967.0*   CBG: No results found for this basename: GLUCAP,  in the last 168 hours  Recent Results (from the past 240 hour(s))  CULTURE, BLOOD (ROUTINE X 2)     Status: None   Collection Time    07/05/13 10:30 PM      Result Value Range Status  Specimen Description BLOOD RIGHT ARM   Final   Special Requests BOTTLES DRAWN AEROBIC ONLY   Final   Culture  Setup Time     Final   Value: 07/06/2013 01:54     Performed at [facility]   Culture     Final   Value:        BLOOD CULTURE RECEIVED NO GROWTH TO DATE CULTURE WILL BE HELD FOR 5 DAYS BEFORE ISSUING A FINAL NEGATIVE REPORT     Performed at [facility]   Report Status PENDING   Incomplete  CULTURE, BLOOD (ROUTINE X 2)     Status: None   Collection Time    07/05/13 10:35 PM      Result Value Range Status   Specimen Description BLOOD RIGHT HAND   Final   Special Requests BOTTLES DRAWN AEROBIC AND ANAEROBIC   Final   Culture  Setup Time     Final   Value: 07/06/2013 01:54     Performed at  [facility]   Culture     Final   Value:        BLOOD CULTURE RECEIVED NO GROWTH TO DATE CULTURE WILL BE HELD FOR 5 DAYS BEFORE ISSUING A FINAL NEGATIVE REPORT     Performed at [facility]   Report Status PENDING   Incomplete     Studies: Dg Chest 1 View  07/06/2013   *RADIOLOGY REPORT*  Clinical Data: Post left thoracentesis.  CHEST - 1 VIEW  Comparison: CT chest 07/05/2013 and chest radiograph 02/20/2013.  Findings: Trachea is midline.  Heart size stable.  No definite pneumothorax after left thoracentesis. Pleural parenchymal opacity in the left hemithorax is better evaluated on 07/05/2013.  Right lung is grossly clear.  Small left pleural effusion.  Surgical clips in the left axilla.  IMPRESSION:  1.  No definite pneumothorax after left thoracentesis.  Small residual left pleural effusion. 2.  Mediastinal adenopathy and left lung air space disease are better evaluated on 07/05/2013.   Original Report Authenticated By: Leanna Battles, M.D.   Dg Chest Port 1 View  07/06/2013   *RADIOLOGY REPORT*  Clinical Data: Post left thoracentesis.  PORTABLE CHEST - 1 VIEW  Comparison: 07/06/2013 at 1104 hours and CT chest 07/05/2013.  Findings: Trachea is midline.  Heart size stable.  Pulmonary hila appear prominent.  Difficult to exclude trace left apical pneumothorax.  Small left pleural effusion.  Mild diffuse interstitial prominence and indistinctness.  Left basilar airspace disease is unchanged.  IMPRESSION:  1.  Difficult to exclude trace left apical pneumothorax. 2.  Small left pleural effusion. 3.  Suspect developing edema. 4.  Left basilar airspace disease, as before. 5.  Prominent pulmonary hila, better evaluated on 07/05/2013.   Original Report Authenticated By: Leanna Battles, M.D.   US Thoracentesis Asp Pleural Space W/img Guide  07/06/2013   *RADIOLOGY REPORT*  Clinical Data:  Left sided pleural effusion, breast cancer, CKD  ULTRASOUND GUIDED LEFT THORACENTESIS  An ultrasound  guided thoracentesis was thoroughly discussed with the patient and questions answered.  The benefits, risks, alternatives and complications were also discussed.  The patient understands and wishes to proceed with the procedure.  Written consent was obtained.  Ultrasound was performed to localize and mark an adequate pocket of fluid in the left chest.  The area was then prepped and draped in the normal sterile fashion.  1% Lidocaine was used for local anesthesia.  Under ultrasound guidance a 19 gauge Griffith Citron  catheter was introduced.  Thoracentesis was performed.  The catheter was removed and a dressing applied.  Complications:  none  Findings: A total of approximately 500cc of serous fluid was removed. A fluid sample was not  sent for laboratory analysis.  IMPRESSION: Successful ultrasound guided left thoracentesis yielding 500cc of pleural fluid.  Read By: Pattricia Boss PA-C   Original Report Authenticated By: Malachy Moan, M.D.    Scheduled Meds: . amLODipine  5 mg Oral Daily  . exemestane  25 mg Oral QPC breakfast  . heparin  5,000 Units Subcutaneous Q8H   Continuous Infusions:    Principal Problem:   Weakness generalized Active Problems:   Breast cancer, right breast   Lymphedema, left arm   Pleural effusion on left   Metabolic acidosis   Breast cancer, stage 4   Hypoxia    Time spent: 35 minutes.    Chaya Jan  [facility] Pager [phone]  If 7PM-7AM, please contact night-coverage at [email or web address], password [facility] 07/07/2013, 2:37 PM  LOS: 2 days

## 2013-07-07 NOTE — Progress Notes (Signed)
CSW assisting with d/c planning. Pt / family have chosen [facility] for SNF placement with Palliative Care services. SNF will have an opening tomorrow if pt is stable for d/c. CSW will continue to follow to assist with d/c planning to SNF.  Cori Razor LCSW [phone]

## 2013-07-07 NOTE — Progress Notes (Signed)
Clinical Social Work Department CLINICAL SOCIAL WORK PLACEMENT NOTE 07/07/2013  Patient:  [name], [name]  Account Number:  [id number] Admit date:  07/05/2013  Clinical Social Worker:  Cori Razor, LCSW  Date/time:  07/07/2013 08:45 AM  Clinical Social Work is seeking post-discharge placement for this patient at the following level of care:   SKILLED NURSING   (*CSW will update this form in Epic as items are completed)     Patient/family provided with [facility] Department of Clinical Social Work's list of facilities offering this level of care within the geographic area requested by the patient (or if unable, by the patient's family).  07/07/2013  Patient/family informed of their freedom to choose among providers that offer the needed level of care, that participate in Medicare, Medicaid or managed care program needed by the patient, have an available bed and are willing to accept the patient.    Patient/family informed of [facility]' ownership interest in [facility], as well as of the fact that they are under no obligation to receive care at this facility.  PASARR submitted to EDS on  PASARR number received from EDS on 02/22/2013  FL2 transmitted to all facilities in geographic area requested by pt/family on  07/07/2013 FL2 transmitted to all facilities within larger geographic area on   Patient informed that his/her managed care company has contracts with or will negotiate with  certain facilities, including the following:     Patient/family informed of bed offers received:   Patient chooses bed at  Physician recommends and patient chooses bed at    Patient to be transferred to  on   Patient to be transferred to facility by   The following physician request were entered in Epic:   Additional Comments:  Cori Razor LCSW [phone]

## 2013-07-07 NOTE — Progress Notes (Signed)
CSW meet with pt to offer support and assist with d/c planning. Pt has GOC meeting this afternoon. She would like to go to rehab following hospital d/c. She has been to [facility] in the past but is open to other SNF's , as well. CSW has initiated SNF search for rehab with possibilly palliative services. Will update SNF's following GOC meeting for any additional d/c needs. Will meet again with pt / update son, following GOC.  Cori Razor LCSW [phone]

## 2013-07-07 NOTE — Consult Note (Signed)
Patient [name]      DOB: 1931-10-02      VWU:[id number]     Consult Note from the Palliative Medicine Team at [facility]    Consult Requested by: Dr Michel Santee     PCP: [name], MD Reason for Consultation: Clarification of GOC and options    Phone Number:[phone]  Assessment of patients Current state: Past medical history of stage IV breast cancer status post left breast lumpectomy in 2010 with lymph node dissection, biopsy done on March thousand and 14 of the right axillary lymph node showed invasive ductal carcinoma currently on chemotherapy, with a CT scan and 02/19/2011 and 14 this showed extensive adenopathy of the mediastinum that comes in for generalized weakness and shortness of breath that started about a week prior to admission progressively getting worse. CT scan of the chest and abdomen was done that showed metastatic disease, to liver and bone and lymphadenopathy, with the left sided effusion worst compared to previous CTs   Pleural effusion with L thoracentesis yeiled 500 cc fluid this hospital stay   Overall failure to thrive, with progressive weakness, weight loss (albumin 2.2), and decreased functional  Status.  Consult is for review of medical treatment options, clarification of goals of care and end of life issues, disposition and options, and symptom recommendation.  This NP Lorinda Creed reviewed medical records, received report from team, assessed the patient and then meet at the patient's bedside along with her two sons Lahoma Constantin # [phone] and Rosalie Doctor  to discuss diagnosis prognosis, GOC, EOL wishes disposition and options.  A detailed discussion was had today regarding advanced directives.  Concepts specific to code status, artifical feeding and hydration, continued IV antibiotics and rehospitalization was had.  The difference between a aggressive medical intervention path  and a palliative comfort care path for this patient at  this time was had.  Values and goals of care important to patient and family were attempted to be elicited.   Discussed in detail their choice and decision to continue treatment under Oncology expertise, following up as directed.  Concept of Hospice and Palliative Care were discussed.   Natural trajectory and expectations at EOL were discussed.  Questions and concerns addressed.  Hard Choices booklet left for review. Family encouraged to call with questions or concerns.  PMT will continue to support holistically.    Goals of Care: 1.  Code Status: DNR/DNI-comfort is main focus of care   2. Scope of Treatment: 1. Vital Signs:per unit  2. Respiratory/Oxygen:for comort 3. Nutritional Support/Tube Feeds:no artifical feeding now or in the future 4. Antibiotics: decision at time of infection (oral only) 5. Blood Products:no 6. IVF:no 7. Review of Medications to be discontinued:         patient and family have made decision for no further treatment or follow-up for stage IV metastatic breast cancer,  Aromasin-discontinued 8. Labs: until dc 9. Telemetry:no 10. Consults:none   4. Disposition: To SNF for rehabilitation in the parameters of full comfort care.  If unsuccessful patient would like to access a residential hospice when she meets eligibility   3. Symptom Management:   1. Anxiety/Agitation: Ativan 0.5 mg every 6 hrs prn 2. Pain: Vicodin 5-325 mg one tablet every 4 hrs prn 3. Bowel Regimen: Dulcolax supp prn 4. Adult failure to thrive/weakness  4. Psychosocial:  Emotional support offered to patient and family.  This have been a very difficult time for them navigating the health care system living with serious/terminal disease.  They are unable to consistently make appointments and  care needs are significant.  Her son who is the main caregiver is on dialysis himself.  All understand her overall poor prognosis and their hope is comfort quality and dignity at this time.  MOST  form completed   Patient Documents Completed or Given: Document Given Completed  Advanced Directives Pkt    MOST  yes  DNR    Gone from My Sight    Hard Choices  yes    Brief ZOX:WRUE medical history of stage IV breast cancer status post left breast lumpectomy in 2010 with lymph node dissection, biopsy done on March thousand and 14 of the right axillary lymph node showed invasive ductal carcinoma currently on chemotherapy, with a CT scan and 02/19/2011 and 14 this showed extensive adenopathy of the mediastinum that comes in for generalized weakness and shortness of breath that started about a week prior to admission progressively getting worse. CT scan of the chest and abdomen was done that showed metastatic disease, to liver and bone and lymphadenopathy, with the left sided effusion worst compared to previous CTs   Pleural effusion with L thoracentesis yeiled 500 cc fluid this hospital stay   Overall failure to thrive, with progressive weakness, weight loss (albumin 2.2), and decreased functional     ROS: weakness, pain left chest area and arm, continued weight loss    PMH:  Past Medical History  Diagnosis Date  . Hypertension   . Diabetes mellitus without complication   . Breast cancer      PSH: Past Surgical History  Procedure Laterality Date  . Breast surgery     I have reviewed the FH and SH and  If appropriate update it with new information. No Known Allergies Scheduled Meds: . amLODipine  5 mg Oral Daily  . exemestane  25 mg Oral QPC breakfast  . heparin  5,000 Units Subcutaneous Q8H   Continuous Infusions:  PRN Meds:.albuterol, HYDROcodone-acetaminophen, LORazepam, morphine injection, ondansetron (ZOFRAN) IV, ondansetron, polyethylene glycol    BP 147/79  Pulse 116  Temp(Src) 98.1 F (36.7 C) (Oral)  Resp 18  Ht 5\' 2"  (1.575 m)  Wt 90.8 kg (200 lb 2.8 oz)  BMI 36.6 kg/m2  SpO2 94%   PPS:30 %   Intake/Output Summary (Last 24 hours) at 07/07/13  1330 Last data filed at 07/07/13 0820  Gross per 24 hour  Intake    960 ml  Output      0 ml  Net    960 ml    Physical Exam:  General: chronically ill appearing elderly female HEENT:  + temporal muscle wasting Chest:   Diminished in bases, CTA, entire anterior chest covered with malignant lesions, noted R mastectomy CVS: tachycardic Abdomen: soft NT +BS Ext: left arm, shoulder to finger tips, edematous, erythematous, + radial pulse Neuro: alert and oriented X3  Labs: CBC    Component Value Date/Time   WBC 5.2 07/07/2013 0445   WBC 5.8 06/21/2013 1454   RBC 4.36 07/07/2013 0445   RBC 4.02 06/21/2013 1454   HGB 11.7* 07/07/2013 0445   HGB 11.1* 06/21/2013 1454   HCT 35.8* 07/07/2013 0445   HCT 34.1* 06/21/2013 1454   PLT 353 07/07/2013 0445   PLT 388 06/21/2013 1454   MCV 82.1 07/07/2013 0445   MCV 84.9 06/21/2013 1454   MCH 26.8 07/07/2013 0445   MCH 27.6 06/21/2013 1454   MCHC 32.7 07/07/2013 0445   MCHC 32.6 06/21/2013 1454   RDW  17.1* 07/07/2013 0445   RDW 17.3* 06/21/2013 1454   LYMPHSABS 0.9 07/05/2013 1200   LYMPHSABS 1.3 06/21/2013 1454   MONOABS 0.8 07/05/2013 1200   MONOABS 0.6 06/21/2013 1454   EOSABS 0.5 07/05/2013 1200   EOSABS 0.4 06/21/2013 1454   BASOSABS 0.0 07/05/2013 1200   BASOSABS 0.0 06/21/2013 1454    BMET    Component Value Date/Time   NA 132* 07/07/2013 0445   NA 136 06/21/2013 1455   K 5.0 07/07/2013 0445   K 5.4* 06/21/2013 1455   CL 102 07/07/2013 0445   CL 108* 03/29/2013 1044   CO2 18* 07/07/2013 0445   CO2 18* 06/21/2013 1455   GLUCOSE 133* 07/07/2013 0445   GLUCOSE 149* 06/21/2013 1455   GLUCOSE 163* 03/29/2013 1044   BUN 18 07/07/2013 0445   BUN 22.3 06/21/2013 1455   CREATININE 1.28* 07/07/2013 0445   CREATININE 1.7* 06/21/2013 1455   CREATININE 1.41* 01/27/2013 1517   CALCIUM 8.8 07/07/2013 0445   CALCIUM 8.8 06/21/2013 1455   GFRNONAA 38* 07/07/2013 0445   GFRAA 45* 07/07/2013 0445    CMP     Component Value Date/Time   NA 132* 07/07/2013 0445   NA  136 06/21/2013 1455   K 5.0 07/07/2013 0445   K 5.4* 06/21/2013 1455   CL 102 07/07/2013 0445   CL 108* 03/29/2013 1044   CO2 18* 07/07/2013 0445   CO2 18* 06/21/2013 1455   GLUCOSE 133* 07/07/2013 0445   GLUCOSE 149* 06/21/2013 1455   GLUCOSE 163* 03/29/2013 1044   BUN 18 07/07/2013 0445   BUN 22.3 06/21/2013 1455   CREATININE 1.28* 07/07/2013 0445   CREATININE 1.7* 06/21/2013 1455   CREATININE 1.41* 01/27/2013 1517   CALCIUM 8.8 07/07/2013 0445   CALCIUM 8.8 06/21/2013 1455   PROT 6.4 07/06/2013 0610   PROT 6.9 06/21/2013 1455   ALBUMIN 2.2* 07/06/2013 0610   ALBUMIN 2.4* 06/21/2013 1455   AST 25 07/06/2013 0610   AST 23 06/21/2013 1455   ALT 5 07/06/2013 0610   ALT <6 06/21/2013 1455   ALKPHOS 82 07/06/2013 0610   ALKPHOS 85 06/21/2013 1455   BILITOT 0.2* 07/06/2013 0610   BILITOT 0.32 06/21/2013 1455   GFRNONAA 38* 07/07/2013 0445   GFRAA 45* 07/07/2013 0445    IMPRESSION:  CT CHEST IMPRESSION 1. Progressive malignancy, with enlarging nodal metastatic disease, worsening rind of left pleural tumor, worsening subcutaneous and cutaneous infiltration of tumor, and new metastatic lesions including the left deltoid muscle. Enlarging left pleural effusion. 2. Progressive edema in the chest and left upper extremity.   CT ABDOMEN AND PELVIS IMPRESSION 1. Progressive metastatic disease, with new liver metastatic lesion, and worsening gastrohepatic ligament, porta hepatis, and mesenteric root adenopathy. 2. Progressive edema in the pannus and along the lateral abdominal wall subcutaneous tissues. 3. Stable destructive lesion of the left iliac bone with extraosseous extension medially. Electronically Signed By: Herbie Baltimore On: 07/05/2013 14:27       Time In Time Out Total Time Spent with Patient Total Overall Time  1300 1440 80 min 100 min    Greater than 50%  of this time was spent counseling and coordinating care related to the above assessment and plan.  Lorinda Creed NP  Palliative Medicine Team Team  Phone # [phone] Pager [phone]  Discussed with Dr Welton Flakes and Dr Michel Santee  Discussed with [facility]

## 2013-07-08 MED ORDER — LORAZEPAM 0.5 MG PO TABS: 0.5000 mg | ORAL_TABLET | Freq: Four times a day (QID) | ORAL | Status: DC | PRN

## 2013-07-08 MED ORDER — TRAMADOL HCL 50 MG PO TABS: 50.0000 mg | ORAL_TABLET | Freq: Four times a day (QID) | ORAL | Status: AC | PRN

## 2013-07-08 MED ORDER — HYDROCODONE-ACETAMINOPHEN 5-325 MG PO TABS: 1.0000 | ORAL_TABLET | ORAL | Status: AC | PRN

## 2013-07-08 MED ORDER — ONDANSETRON HCL 4 MG PO TABS: 4.0000 mg | ORAL_TABLET | Freq: Four times a day (QID) | ORAL | Status: DC | PRN

## 2013-07-08 NOTE — Progress Notes (Signed)
Discharge instructions accompanied pt, left the unit in stable condition via ambulance to SNF. 

## 2013-07-08 NOTE — Progress Notes (Signed)
Clinical Social Work Department CLINICAL SOCIAL WORK PLACEMENT NOTE 07/08/2013  Patient:  [name], Carol  Account Number:  [id number] Admit date:  07/05/2013  Clinical Social Worker:  Cori Razor, LCSW  Date/time:  07/07/2013 08:45 AM  Clinical Social Work is seeking post-discharge placement for this patient at the following level of care:   SKILLED NURSING   (*CSW will update this form in Epic as items are completed)     Patient/family provided with [facility] Department of Clinical Social Work's list of facilities offering this level of care within the geographic area requested by the patient (or if unable, by the patient's family).  07/07/2013  Patient/family informed of their freedom to choose among providers that offer the needed level of care, that participate in Medicare, Medicaid or managed care program needed by the patient, have an available bed and are willing to accept the patient.    Patient/family informed of [facility]' ownership interest in [facility], as well as of the fact that they are under no obligation to receive care at this facility.  PASARR submitted to EDS on  PASARR number received from EDS on 02/22/2013  FL2 transmitted to all facilities in geographic area requested by pt/family on  07/07/2013 FL2 transmitted to all facilities within larger geographic area on   Patient informed that his/her managed care company has contracts with or will negotiate with  certain facilities, including the following:     Patient/family informed of bed offers received:  07/07/2013 Patient chooses bed at [facility] [place] Physician recommends and patient chooses bed at    Patient to be transferred to [facility] [place] on  07/08/2013 Patient to be transferred to facility by [facility]  The following physician request were entered in Epic:   Additional Comments: PT/OT consulted for transport recommendations. Advised that pt  requires non emergency amb transport. Family aware and in agreement with plan. Son has been alerted that this is not guarantee that insurance will cover cost and copayments may apply.  Cori Razor LCSW [phone]

## 2013-07-08 NOTE — Progress Notes (Signed)
Physical Therapy Treatment Patient Details Name: [name] MRN: [id number] DOB: 15-Jan-1931 Today's Date: 07/08/2013 Time: 9147-8295 PT Time Calculation (min): 11 min  PT Assessment / Plan / Recommendation  History of Present Illness Plan is for pt to d/c to [facility] today.  At present time, she is very nauseous and deferring out of baed activity   PT Comments   Pt limited in mobility today by nausea and not wanting to move around.  In light of this , recommend transport via ambulance. Pt was able to move her legs and assist with repositioning.  Extra pillows used to support arms and head of bed raised to try to find more comfortable position for her.   Follow Up Recommendations  SNF     Does the patient have the potential to tolerate intense rehabilitation     Barriers to Discharge        Equipment Recommendations       Recommendations for Other Services    Frequency     Progress towards PT Goals Progress towards PT goals: Not progressing toward goals - comment (due to nausea)  Plan Current plan remains appropriate    Precautions / Restrictions     Pertinent Vitals/Pain nausea    Mobility  Bed Mobility Details for Bed Mobility Assistance: pt observed moving in the bed to try to find a more comfortable postition to deal with her nausea and epigastric pain    Exercises General Exercises - Lower Extremity Ankle Circles/Pumps: AROM;5 reps;Supine   PT Diagnosis:    PT Problem List:   PT Treatment Interventions:     PT Goals (current goals can now be found in the care plan section)    Visit Information  Last PT Received On: 07/08/13 Assistance Needed: +1 History of Present Illness: Plan is for pt to d/c to [facility] today.  At present time, she is very nauseous and deferring out of baed activity                End of Session PT - End of Session Activity Tolerance: Treatment limited secondary to medical complications (Comment) (nausea) Patient  left: in bed;with call bell/phone within reach Nurse Communication: Other (comment) (need to assess for nausea)   GP     [name] 07/08/2013, 9:20 AM

## 2013-07-08 NOTE — Discharge Summary (Signed)
Physician Discharge Summary  [name]:[id number] DOB: 1930-12-24 DOA: 07/05/2013  PCP: [name], MD  Admit date: 07/05/2013 Discharge date: 07/08/2013  Time spent: 45 minutes  Recommendations for Outpatient Follow-up:  -To SNF today.   Discharge Diagnoses:  Principal Problem:   Weakness generalized Active Problems:   Breast cancer, right breast   Lymphedema, left arm   Pleural effusion on left   Metabolic acidosis   Breast cancer, stage 4   Hypoxia   Pain of left arm   Palliative care encounter   Discharge Condition: Stable and improved  Filed Weights   07/05/13 1527  Weight: 90.8 kg (200 lb 2.8 oz)    History of present illness:  [name] is a 79 y.o. female with past medical history of stage IV breast cancer currently on chemotherapy, with a CT scan on 02/19/2011 and 14 this showed extensive adenopathy of the mediastinum that comes in for generalized weakness and shortness of breath that started about a week prior to admission progressively getting worse. We were asked to admit her for further evaluation and management.   Hospital Course:   Acute Hypoxemic Respiratory Failure/Left Pleural Effusion  -Resolved after therapeutic thoracentesis.  -This has been a recurrent issue for her, and if needs frequent thoracentesis may benefit form placement of a palliative pleurex catheter.  -She currently has no oxygen requirements.  ?Left Arm Cellulitis  -She has chronic lymphedema and numerous subcutaneous cancerous nodules. -With absence of fever, leukocytosis, erythema do not believe she is displaying signs of active infection and will elect to discontinue antibiotic coverage at this time.   Stage IV Breast Cancer  -Palliative care consult has been requested.  -Patient and family have elected to not continue treatment and to proceed to SNF with palliative care following.   Procedures:  Left US-giuied thoracentesis  On 8/27 with removal of  500 cc of serous fluid.  Consultations:  Palliative care  Discharge Instructions  Discharge Orders   Future Orders Complete By Expires   Discontinue IV  As directed    Increase activity slowly  As directed        Medication List    STOP taking these medications       exemestane 25 MG tablet  Commonly known as:  AROMASIN     simvastatin 20 MG tablet  Commonly known as:  ZOCOR      TAKE these medications       albuterol 108 (90 BASE) MCG/ACT inhaler  Commonly known as:  PROVENTIL HFA;VENTOLIN HFA  Inhale 2 puffs into the lungs every 6 (six) hours as needed for wheezing.     amLODipine 5 MG tablet  Commonly known as:  NORVASC  Take 5 mg by mouth daily.     atorvastatin 10 MG tablet  Commonly known as:  LIPITOR  Take 10 mg by mouth daily.     cyclobenzaprine 10 MG tablet  Commonly known as:  FLEXERIL  Take 10 mg by mouth 2 (two) times daily as needed for muscle spasms.     glipiZIDE 10 MG tablet  Commonly known as:  GLUCOTROL  Take 10-20 mg by mouth 2 (two) times daily before a meal. Takes 2 in am and 1 in evening     HYDROcodone-acetaminophen 5-325 MG per tablet  Commonly known as:  NORCO/VICODIN  Take 1 tablet by mouth every 4 (four) hours as needed.     LORazepam 0.5 MG tablet  Commonly known as:  ATIVAN  Take 1 tablet (  0.5 mg total) by mouth every 6 (six) hours as needed for anxiety.     ondansetron 4 MG tablet  Commonly known as:  ZOFRAN  Take 1 tablet (4 mg total) by mouth every 6 (six) hours as needed for nausea.     pantoprazole 40 MG tablet  Commonly known as:  PROTONIX  Take 40 mg by mouth daily.     traMADol 50 MG tablet  Commonly known as:  ULTRAM  Take 1 tablet (50 mg total) by mouth every 6 (six) hours as needed for pain.       No Known Allergies    The results of significant diagnostics from this hospitalization (including imaging, microbiology, ancillary and laboratory) are listed below for reference.    Significant Diagnostic  Studies: Dg Chest 1 View  07/06/2013   *RADIOLOGY REPORT*  Clinical Data: Post left thoracentesis.  CHEST - 1 VIEW  Comparison: CT chest 07/05/2013 and chest radiograph 02/20/2013.  Findings: Trachea is midline.  Heart size stable.  No definite pneumothorax after left thoracentesis. Pleural parenchymal opacity in the left hemithorax is better evaluated on 07/05/2013.  Right lung is grossly clear.  Small left pleural effusion.  Surgical clips in the left axilla.  IMPRESSION:  1.  No definite pneumothorax after left thoracentesis.  Small residual left pleural effusion. 2.  Mediastinal adenopathy and left lung air space disease are better evaluated on 07/05/2013.   Original Report Authenticated By: Leanna Battles, M.D.   Ct Chest W Contrast  07/05/2013   CLINICAL DATA:  Breast cancer. Diabetes. Hypertension. Lymphedema of the left arm. Left pleural effusion.  EXAM: CT CHEST, ABDOMEN, AND PELVIS WITH CONTRAST  TECHNIQUE: Multidetector CT imaging of the chest, abdomen and pelvis was performed following the standard protocol during bolus administration of intravenous contrast.  CONTRAST:  OMNIPAQUE IOHEXOL 300 MG/ML  SOLN  COMPARISON:  Multiple exams, including 03/10/2013  FINDINGS: CT CHEST FINDINGS  Worsened bilateral subcutaneous edema along the thorax and left upper arm. Increased skin nodularity along the right breast suspicious for diffuse tumor infiltration. Increased nodularity in the glandular tissues of the right breast. Dominant right axillary mass 5.1 x 4.3 cm, formerly 4.5 x 3.4 cm.  Nodularity along the left pectoral os musculature. Enhancing lesion along the left anterior deltoid muscle, 1.7 x 2.4 cm on image 7 of series 2.  Worsened thoracic adenopathy. Prevascular node short axis 1 degree 0.9 cm, formerly 1.3 cm by my measurements. Node adjacent to the left pulmonary artery 1.6 cm, formerly 1.3 cm by my measurements. Precarinal node short axis diameter 1.3 cm, formerly 0.7 cm by my  measurements.  No current pericardial effusion. Pleural rind of metastatic disease on the left demonstrates increase conspicuity. Moderate left effusion is larger than before. Increased atelectasis particularly in the left lower lobe. Subcutaneous lesions along the left back as on images 22-27 of series 2 could represent subcutaneous metastatic lesions.  CT ABDOMEN AND PELVIS FINDINGS  1.7 x 1.9 cm complex lesion in segment 3 of the liver, compatible with metastatic disease and new from recent PET-CT. Hypodense lesion in segment 4 along the falciform ligament is more likely to be due to fatty infiltration.  Spleen and pancreas unremarkable. Gastrohepatic ligament adenopathy, 1.9 cm in short axis (formerly 1.3 cm). Peripancreatic node 2.0 cm in short axis (formerly 1.2 cm). Left periaortic node adjacent to the adrenal gland, 1.9 cm in short axis, stable. Left periaortic node just below the renal arteries, 1.8 cm in short axis, stable. Adenopathy  along the root of the mesentery has increased, index node short axis diameter 1.3 cm, formerly 0.9 cm.  Edema from the chest tracks down into the pannus and along the lateral abdominal subcutaneous tissues.  Uterus absent.  Renal cysts noted.  Similar distribution and appearance of destructive left iliac bone lesion with soft tissue component tracking between the iliac bone and sacrum.  IMPRESSION: CT CHEST IMPRESSION  1. Progressive malignancy, with enlarging nodal metastatic disease, worsening rind of left pleural tumor, worsening subcutaneous and cutaneous infiltration of tumor, and new metastatic lesions including the left deltoid muscle. Enlarging left pleural effusion. 2. Progressive edema in the chest and left upper extremity.  CT ABDOMEN AND PELVIS IMPRESSION  1. Progressive metastatic disease, with new liver metastatic lesion, and worsening gastrohepatic ligament, porta hepatis, and mesenteric root adenopathy. 2. Progressive edema in the pannus and along the lateral  abdominal wall subcutaneous tissues. 3. Stable destructive lesion of the left iliac bone with extraosseous extension medially.   Electronically Signed   By: Herbie Baltimore   On: 07/05/2013 14:27   Ct Abdomen Pelvis W Contrast  07/05/2013   CLINICAL DATA:  Breast cancer. Diabetes. Hypertension. Lymphedema of the left arm. Left pleural effusion.  EXAM: CT CHEST, ABDOMEN, AND PELVIS WITH CONTRAST  TECHNIQUE: Multidetector CT imaging of the chest, abdomen and pelvis was performed following the standard protocol during bolus administration of intravenous contrast.  CONTRAST:  OMNIPAQUE IOHEXOL 300 MG/ML  SOLN  COMPARISON:  Multiple exams, including 03/10/2013  FINDINGS: CT CHEST FINDINGS  Worsened bilateral subcutaneous edema along the thorax and left upper arm. Increased skin nodularity along the right breast suspicious for diffuse tumor infiltration. Increased nodularity in the glandular tissues of the right breast. Dominant right axillary mass 5.1 x 4.3 cm, formerly 4.5 x 3.4 cm.  Nodularity along the left pectoral os musculature. Enhancing lesion along the left anterior deltoid muscle, 1.7 x 2.4 cm on image 7 of series 2.  Worsened thoracic adenopathy. Prevascular node short axis 1 degree 0.9 cm, formerly 1.3 cm by my measurements. Node adjacent to the left pulmonary artery 1.6 cm, formerly 1.3 cm by my measurements. Precarinal node short axis diameter 1.3 cm, formerly 0.7 cm by my measurements.  No current pericardial effusion. Pleural rind of metastatic disease on the left demonstrates increase conspicuity. Moderate left effusion is larger than before. Increased atelectasis particularly in the left lower lobe. Subcutaneous lesions along the left back as on images 22-27 of series 2 could represent subcutaneous metastatic lesions.  CT ABDOMEN AND PELVIS FINDINGS  1.7 x 1.9 cm complex lesion in segment 3 of the liver, compatible with metastatic disease and new from recent PET-CT. Hypodense lesion in segment  4 along the falciform ligament is more likely to be due to fatty infiltration.  Spleen and pancreas unremarkable. Gastrohepatic ligament adenopathy, 1.9 cm in short axis (formerly 1.3 cm). Peripancreatic node 2.0 cm in short axis (formerly 1.2 cm). Left periaortic node adjacent to the adrenal gland, 1.9 cm in short axis, stable. Left periaortic node just below the renal arteries, 1.8 cm in short axis, stable. Adenopathy along the root of the mesentery has increased, index node short axis diameter 1.3 cm, formerly 0.9 cm.  Edema from the chest tracks down into the pannus and along the lateral abdominal subcutaneous tissues.  Uterus absent.  Renal cysts noted.  Similar distribution and appearance of destructive left iliac bone lesion with soft tissue component tracking between the iliac bone and sacrum.  IMPRESSION: CT  CHEST IMPRESSION  1. Progressive malignancy, with enlarging nodal metastatic disease, worsening rind of left pleural tumor, worsening subcutaneous and cutaneous infiltration of tumor, and new metastatic lesions including the left deltoid muscle. Enlarging left pleural effusion. 2. Progressive edema in the chest and left upper extremity.  CT ABDOMEN AND PELVIS IMPRESSION  1. Progressive metastatic disease, with new liver metastatic lesion, and worsening gastrohepatic ligament, porta hepatis, and mesenteric root adenopathy. 2. Progressive edema in the pannus and along the lateral abdominal wall subcutaneous tissues. 3. Stable destructive lesion of the left iliac bone with extraosseous extension medially.   Electronically Signed   By: Herbie Baltimore   On: 07/05/2013 14:27   Dg Chest Port 1 View  07/06/2013   *RADIOLOGY REPORT*  Clinical Data: Post left thoracentesis.  PORTABLE CHEST - 1 VIEW  Comparison: 07/06/2013 at 1104 hours and CT chest 07/05/2013.  Findings: Trachea is midline.  Heart size stable.  Pulmonary hila appear prominent.  Difficult to exclude trace left apical pneumothorax.  Small left  pleural effusion.  Mild diffuse interstitial prominence and indistinctness.  Left basilar airspace disease is unchanged.  IMPRESSION:  1.  Difficult to exclude trace left apical pneumothorax. 2.  Small left pleural effusion. 3.  Suspect developing edema. 4.  Left basilar airspace disease, as before. 5.  Prominent pulmonary hila, better evaluated on 07/05/2013.   Original Report Authenticated By: Leanna Battles, M.D.   US Thoracentesis Asp Pleural Space W/img Guide  07/06/2013   *RADIOLOGY REPORT*  Clinical Data:  Left sided pleural effusion, breast cancer, CKD  ULTRASOUND GUIDED LEFT THORACENTESIS  An ultrasound guided thoracentesis was thoroughly discussed with the patient and questions answered.  The benefits, risks, alternatives and complications were also discussed.  The patient understands and wishes to proceed with the procedure.  Written consent was obtained.  Ultrasound was performed to localize and mark an adequate pocket of fluid in the left chest.  The area was then prepped and draped in the normal sterile fashion.  1% Lidocaine was used for local anesthesia.  Under ultrasound guidance a 19 gauge Yueh catheter was introduced.  Thoracentesis was performed.  The catheter was removed and a dressing applied.  Complications:  none  Findings: A total of approximately 500cc of serous fluid was removed. A fluid sample was not  sent for laboratory analysis.  IMPRESSION: Successful ultrasound guided left thoracentesis yielding 500cc of pleural fluid.  Read By: Pattricia Boss PA-C   Original Report Authenticated By: Malachy Moan, M.D.    Microbiology: Recent Results (from the past 240 hour(s))  CULTURE, BLOOD (ROUTINE X 2)     Status: None   Collection Time    07/05/13 10:30 PM      Result Value Range Status   Specimen Description BLOOD RIGHT ARM   Final   Special Requests BOTTLES DRAWN AEROBIC ONLY   Final   Culture  Setup Time     Final   Value: 07/06/2013 01:54     Performed at [facility]   Culture     Final   Value:        BLOOD CULTURE RECEIVED NO GROWTH TO DATE CULTURE WILL BE HELD FOR 5 DAYS BEFORE ISSUING A FINAL NEGATIVE REPORT     Performed at [facility]   Report Status PENDING   Incomplete  CULTURE, BLOOD (ROUTINE X 2)     Status: None   Collection Time    07/05/13 10:35 PM      Result  Value Range Status   Specimen Description BLOOD RIGHT HAND   Final   Special Requests BOTTLES DRAWN AEROBIC AND ANAEROBIC   Final   Culture  Setup Time     Final   Value: 07/06/2013 01:54     Performed at [facility]   Culture     Final   Value:        BLOOD CULTURE RECEIVED NO GROWTH TO DATE CULTURE WILL BE HELD FOR 5 DAYS BEFORE ISSUING A FINAL NEGATIVE REPORT     Performed at [facility]   Report Status PENDING   Incomplete     Labs: Basic Metabolic Panel:  Recent Labs Lab 07/05/13 1200 07/06/13 0610 07/07/13 0445  NA 132* 134* 132*  K 4.6 5.0 5.0  CL 102 103 102  CO2 17* 18* 18*  GLUCOSE 102* 111* 133*  BUN 20 18 18   CREATININE 1.70* 1.43* 1.28*  CALCIUM 9.3 8.6 8.8   Liver Function Tests:  Recent Labs Lab 07/06/13 0610  AST 25  ALT 5  ALKPHOS 82  BILITOT 0.2*  PROT 6.4  ALBUMIN 2.2*   No results found for this basename: LIPASE, AMYLASE,  in the last 168 hours No results found for this basename: AMMONIA,  in the last 168 hours CBC:  Recent Labs Lab 07/05/13 1200 07/06/13 0422 07/07/13 0445  WBC 5.5 5.3 5.2  NEUTROABS 3.4  --   --   HGB 11.2* 11.0* 11.7*  HCT 34.7* 33.9* 35.8*  MCV 82.0 81.7 82.1  PLT 417* 394 353   Cardiac Enzymes: No results found for this basename: CKTOTAL, CKMB, CKMBINDEX, TROPONINI,  in the last 168 hours BNP: BNP (last 3 results)  Recent Labs  02/17/13 0935  PROBNP 4967.0*   CBG:  Recent Labs Lab 07/07/13 1516  GLUCAP 192*       Signed:  HERNANDEZ ACOSTA,ESTELA  [facility] Pager: [phone] 07/08/2013, 8:33 AM

## 2013-07-11 NOTE — Consult Note (Signed)
I have reviewed and discussed the care of this patient in detail with the nurse practitioner including pertinent patient records, physical exam findings and data. I agree with details of this encounter.  

## 2013-07-12 ENCOUNTER — Non-Acute Institutional Stay (SKILLED_NURSING_FACILITY): Payer: Medicare Other | Admitting: Internal Medicine

## 2013-07-12 ENCOUNTER — Encounter: Payer: Self-pay | Admitting: Internal Medicine

## 2013-07-12 DIAGNOSIS — N058 Unspecified nephritic syndrome with other morphologic changes: Secondary | ICD-10-CM

## 2013-07-12 DIAGNOSIS — E1129 Type 2 diabetes mellitus with other diabetic kidney complication: Secondary | ICD-10-CM | POA: Insufficient documentation

## 2013-07-12 DIAGNOSIS — R531 Weakness: Secondary | ICD-10-CM

## 2013-07-12 DIAGNOSIS — Z515 Encounter for palliative care: Secondary | ICD-10-CM

## 2013-07-12 DIAGNOSIS — R5383 Other fatigue: Secondary | ICD-10-CM

## 2013-07-12 DIAGNOSIS — I10 Essential (primary) hypertension: Secondary | ICD-10-CM

## 2013-07-12 DIAGNOSIS — R5381 Other malaise: Secondary | ICD-10-CM

## 2013-07-12 DIAGNOSIS — E785 Hyperlipidemia, unspecified: Secondary | ICD-10-CM | POA: Insufficient documentation

## 2013-07-12 DIAGNOSIS — I890 Lymphedema, not elsewhere classified: Secondary | ICD-10-CM

## 2013-07-12 DIAGNOSIS — C50919 Malignant neoplasm of unspecified site of unspecified female breast: Secondary | ICD-10-CM

## 2013-07-12 DIAGNOSIS — J90 Pleural effusion, not elsewhere classified: Secondary | ICD-10-CM

## 2013-07-12 LAB — CULTURE, BLOOD (ROUTINE X 2)
Culture: NO GROWTH
Culture: NO GROWTH

## 2013-07-12 NOTE — Progress Notes (Signed)
Patient ID: [name], female   DOB: [birth year], 79 y.o.   MRN: [id number] Provider:  Gwenith Spitz. Renato Gails, D.O., C.M.D. Location:  [facility]   PCP: [name], MD  Code Status: DNR, comfort measures--palliative care   No Known Allergies  Chief Complaint  Patient presents with  . Hospitalization Follow-up    HPI: 79 y.o. female with h/o stage IV breast cancer right, left arm chronic lymphadenopathy with numerous subcutaneous nodules, DMII, cardiomegaly, anemia, recurrent pleural effusion, left breast lumpectomy 2012 with lymph node dissection, mets to liver and bone, CKD, left thoracentesis with 500 cc serous fluid on 07/06/13.  She is not getting any more chemotherapy--she says that her family made this decision with the doctors.  She did not understand why and I explained to her that her disease was no longer responding to the treatments and she was getting too ill from them.      ROS: Review of Systems  Constitutional: Positive for malaise/fatigue. Negative for fever and chills.  HENT: Negative for congestion.   Eyes: Negative for blurred vision.  Respiratory: Negative for cough and shortness of breath.   Cardiovascular: Positive for chest pain and leg swelling. Negative for palpitations.       Anasarca  Gastrointestinal: Positive for nausea. Negative for vomiting, abdominal pain and constipation.  Genitourinary: Negative for dysuria.  Musculoskeletal: Negative for falls.       Chest and shoulder tenderness and pain  Skin:       Subcutaneous nodules present over chest wall, shoulders and upper back  Neurological: Positive for weakness. Negative for dizziness, loss of consciousness and headaches.  Endo/Heme/Allergies:       Diabetes  Psychiatric/Behavioral: Negative for depression and memory loss.     Past Medical History  Diagnosis Date  . Hypertension   . Diabetes mellitus without complication   . Breast cancer    Past Surgical History   Procedure Laterality Date  . Breast surgery     Social History:   reports that she has been smoking.  She has never used smokeless tobacco. She reports that she does not drink alcohol or use illicit drugs.  Family History  Problem Relation Age of Onset  . Other Mother   . Other Father     Medications: Patient's Medications  New Prescriptions   No medications on file  Previous Medications   ALBUTEROL (PROVENTIL HFA;VENTOLIN HFA) 108 (90 BASE) MCG/ACT INHALER    Inhale 2 puffs into the lungs every 6 (six) hours as needed for wheezing.   AMLODIPINE (NORVASC) 5 MG TABLET    Take 5 mg by mouth daily.   ATORVASTATIN (LIPITOR) 10 MG TABLET    Take 10 mg by mouth daily.   CYCLOBENZAPRINE (FLEXERIL) 10 MG TABLET    Take 10 mg by mouth 2 (two) times daily as needed for muscle spasms.    GLIPIZIDE (GLUCOTROL) 10 MG TABLET    Take 10-20 mg by mouth 2 (two) times daily before a meal. Takes 2 in am and 1 in evening   HYDROCODONE-ACETAMINOPHEN (NORCO/VICODIN) 5-325 MG PER TABLET    Take 1 tablet by mouth every 4 (four) hours as needed.   LORAZEPAM (ATIVAN) 0.5 MG TABLET    Take 1 tablet (0.5 mg total) by mouth every 6 (six) hours as needed for anxiety.   ONDANSETRON (ZOFRAN) 4 MG TABLET    Take 1 tablet (4 mg total) by mouth every 6 (six) hours as needed for nausea.   PANTOPRAZOLE (PROTONIX)  40 MG TABLET    Take 40 mg by mouth daily.   TRAMADOL (ULTRAM) 50 MG TABLET    Take 1 tablet (50 mg total) by mouth every 6 (six) hours as needed for pain.  Modified Medications   No medications on file  Discontinued Medications   No medications on file     Physical Exam: Physical Exam  Vitals reviewed. Constitutional: She is oriented to person, place, and time.  Obese black female with anasarca resting in bed, in pain with movement of her upper body  Cardiovascular: Normal rate, regular rhythm and normal heart sounds.   Tenderness over shoulders, chest and with movement of upper body   Pulmonary/Chest: Effort normal and breath sounds normal.  Abdominal: Soft. Bowel sounds are normal. She exhibits no distension and no mass. There is no tenderness.  Musculoskeletal: She exhibits edema and tenderness.  Neurological: She is alert and oriented to person, place, and time. No cranial nerve deficit.  Skin:  Multiple subcutaneous nodules over upper chest wall especially over left shoulder and upper back area, some are open and draining     Labs reviewed: Basic Metabolic Panel:  Recent Labs  62/95/28 0436 02/23/13 0535  07/05/13 1200 07/06/13 0610 07/07/13 0445  NA 134* 131*  < > 132* 134* 132*  K 4.6 4.7  < > 4.6 5.0 5.0  CL 97 95*  < > 102 103 102  CO2 27 25  < > 17* 18* 18*  GLUCOSE 141* 147*  < > 102* 111* 133*  BUN 32* 37*  < > 20 18 18   CREATININE 1.19* 1.17*  < > 1.70* 1.43* 1.28*  CALCIUM 9.3 9.6  < > 9.3 8.6 8.8  MG  --  1.6  --   --   --   --   < > = values in this interval not displayed. Liver Function Tests:  Recent Labs  05/10/13 1014 06/21/13 1455 07/06/13 0610  AST 27 23 25   ALT 7 <6 5  ALKPHOS 94 85 82  BILITOT 0.33 0.32 0.2*  PROT 7.3 6.9 6.4  ALBUMIN 2.7* 2.4* 2.2*  CBC:  Recent Labs  05/10/13 1014 06/21/13 1454 07/05/13 1200 07/06/13 0422 07/07/13 0445  WBC 7.8 5.8 5.5 5.3 5.2  NEUTROABS 5.6 3.4 3.4  --   --   HGB 12.0 11.1* 11.2* 11.0* 11.7*  HCT 35.8 34.1* 34.7* 33.9* 35.8*  MCV 84.0 84.9 82.0 81.7 82.1  PLT 358 388 417* 394 353   Cardiac Enzymes:  Recent Labs  02/20/13 0801 02/20/13 1328 02/20/13 1932  TROPONINI <0.30 <0.30 <0.30  CBG:  Recent Labs  03/10/13 1307 07/07/13 1516  GLUCAP 135* 192*    Assessment/Plan 1. Pleural effusion on left -respiratory status is stable at this time with oxygen use-denies dyspnea  2. Breast cancer, stage 4 -will move toward a more palliative route and ideally hospice care for her quality of life at this point Comfort approaches: Begin dulcolax pr today and q 3 days if  no bm Senna s at hs D/c zofran Start phenergan IM q6h prn nausea D/c lipitor  3. Lymphedema, left arm -elevation of arm, due to breast cancer, seems to be getting progressively worse and may need diuretic adjustment  4. Palliative care encounter --will consult palliative care to discuss idea of hospice with patient and her family as they had not been ready for this quite yet  5. Weakness generalized -initially sent here for therapy from the hospital, but cannot do too much  of this at this point  6. Essential hypertension, benign -would not treat bp aggressively at this stage, satisfactory upon review  7. DM (diabetes mellitus) type II controlled with renal manifestation -d/c glipizide to prevent hypoglycemia with poor po intake and nausea and will use insulin for glucose control  8. Hyperlipidemia LDL goal < 100 -d/c lipitor due to end stage disease   Functional status: dependent in adls except feeding  Family/ staff Communication: discussed with pt, unit supervisor and left a message on her son's cell phone to return my call  Labs/tests ordered: palliative care consult

## 2013-07-13 ENCOUNTER — Other Ambulatory Visit: Payer: Medicare Other | Admitting: Lab

## 2013-07-13 ENCOUNTER — Ambulatory Visit: Payer: Medicare Other

## 2013-07-13 ENCOUNTER — Ambulatory Visit: Payer: Medicare Other | Admitting: Adult Health

## 2013-07-26 ENCOUNTER — Non-Acute Institutional Stay (SKILLED_NURSING_FACILITY): Payer: Medicare Other | Admitting: Internal Medicine

## 2013-07-26 ENCOUNTER — Encounter: Payer: Self-pay | Admitting: Internal Medicine

## 2013-07-26 DIAGNOSIS — R5383 Other fatigue: Secondary | ICD-10-CM

## 2013-07-26 DIAGNOSIS — I890 Lymphedema, not elsewhere classified: Secondary | ICD-10-CM

## 2013-07-26 DIAGNOSIS — R601 Generalized edema: Secondary | ICD-10-CM

## 2013-07-26 DIAGNOSIS — E119 Type 2 diabetes mellitus without complications: Secondary | ICD-10-CM

## 2013-07-26 DIAGNOSIS — C50919 Malignant neoplasm of unspecified site of unspecified female breast: Secondary | ICD-10-CM

## 2013-07-26 DIAGNOSIS — R609 Edema, unspecified: Secondary | ICD-10-CM

## 2013-07-26 DIAGNOSIS — C50911 Malignant neoplasm of unspecified site of right female breast: Secondary | ICD-10-CM

## 2013-07-26 DIAGNOSIS — R531 Weakness: Secondary | ICD-10-CM

## 2013-07-26 DIAGNOSIS — R5381 Other malaise: Secondary | ICD-10-CM

## 2013-07-26 MED ORDER — BISACODYL 10 MG RE SUPP: 10.0000 mg | RECTAL | Status: AC | PRN

## 2013-07-26 MED ORDER — INSULIN ASPART 100 UNIT/ML ~~LOC~~ SOLN: 5.0000 [IU] | Freq: Three times a day (TID) | SUBCUTANEOUS | Status: AC

## 2013-07-26 MED ORDER — PROMETHAZINE HCL 25 MG/ML IJ SOLN: 25.0000 mg | Freq: Four times a day (QID) | INTRAMUSCULAR | Status: DC | PRN

## 2013-07-26 NOTE — Progress Notes (Signed)
Patient ID: RUT BETTERTON, female   DOB: September 21, 1931, 79 y.o.   MRN: [id number] Location:  [facility] Provider:  Elmarie Shiley L. Renato Gails, D.O., C.M.D.  Code Status:  DNR  Chief Complaint  Patient presents with  . Weight Gain    10 lbs in 10 days, 3 lbs in 1 day, has stage IV breast cancer with lymphedema, on palliative care    HPI:  79 yo black female with Stage IV breast cancer with lymphedema here for short term rehab s/p hospitalization seen acutely due to weight gain.  She is receiving palliative care services.   Admission weight was 194 8/29, but weighed 208 on 07/24/13.  She admits to shortness of breath but has not noted any worsening of this over the past few days.  She is aware of increased edema of her extremities, especially the left arm.  Her pain is adequately controlled.  She is very pleasant and does not appear depressed.  Review of Systems:  Review of Systems  Constitutional: Negative for fever and chills.       Weight gain  Eyes: Negative for blurred vision.  Respiratory: Positive for shortness of breath.        Unchanged  Cardiovascular: Negative for chest pain.  Gastrointestinal: Negative for abdominal pain and constipation.  Genitourinary: Negative for dysuria.  Musculoskeletal:       Pain in shoulder, extremities, chest is controlled with current medications  Skin: Positive for rash.       Skin changes from metastatic disease on left shoulder, upper back  Neurological: Negative for dizziness and headaches.  Psychiatric/Behavioral: Negative for depression.    Medications: Patient's Medications  New Prescriptions   No medications on file  Previous Medications   ALBUTEROL (PROVENTIL HFA;VENTOLIN HFA) 108 (90 BASE) MCG/ACT INHALER    Inhale 2 puffs into the lungs every 6 (six) hours as needed for wheezing.   AMLODIPINE (NORVASC) 5 MG TABLET    Take 5 mg by mouth daily.   ATORVASTATIN (LIPITOR) 10 MG TABLET    Take 10 mg by mouth daily.   CYCLOBENZAPRINE (FLEXERIL) 10 MG TABLET    Take 10 mg by mouth 2 (two) times daily as needed for muscle spasms.    GLIPIZIDE (GLUCOTROL) 10 MG TABLET    Take 10-20 mg by mouth 2 (two) times daily before a meal. Takes 2 in am and 1 in evening   HYDROCODONE-ACETAMINOPHEN (NORCO/VICODIN) 5-325 MG PER TABLET    Take 1 tablet by mouth every 4 (four) hours as needed.   LORAZEPAM (ATIVAN) 0.5 MG TABLET    Take 1 tablet (0.5 mg total) by mouth every 6 (six) hours as needed for anxiety.   ONDANSETRON (ZOFRAN) 4 MG TABLET    Take 1 tablet (4 mg total) by mouth every 6 (six) hours as needed for nausea.   PANTOPRAZOLE (PROTONIX) 40 MG TABLET    Take 40 mg by mouth daily.   TRAMADOL (ULTRAM) 50 MG TABLET    Take 1 tablet (50 mg total) by mouth every 6 (six) hours as needed for pain.  Modified Medications   No medications on file  Discontinued Medications   No medications on file    Physical Exam: Filed Vitals:   07/26/13 0936  BP: 130/70  Pulse: 98  Temp: 98.2 F (36.8 C)  Resp: 17  Height: 5\' 2"  (1.575 m)  Weight: 208 lb (94.348 kg)  SpO2: 94%  Physical Exam  Constitutional:  Obese black female with severe anasarca  HENT:  Head: Normocephalic and atraumatic.  Cardiovascular: Normal rate, regular rhythm and normal heart sounds.   Pulmonary/Chest: Effort normal. She has rales.    Rales are throughout posterior lung fields Left shoulder with thickened tissue, red papules present over posterior shoulder and upper back, edema of all 4 extremities, but worst in left arm  Abdominal: Soft. Bowel sounds are normal.  Musculoskeletal: She exhibits edema and tenderness.    Labs reviewed: Basic Metabolic Panel:  Recent Labs  16/10/96 0436 02/23/13 0535  07/05/13 1200 07/06/13 0610 07/07/13 0445  NA 134* 131*  < > 132* 134* 132*  K 4.6 4.7  < > 4.6 5.0 5.0  CL 97 95*  < > 102 103 102  CO2 27 25  < > 17* 18* 18*  GLUCOSE 141* 147*  < > 102* 111* 133*  BUN 32* 37*  < > 20 18 18   CREATININE  1.19* 1.17*  < > 1.70* 1.43* 1.28*  CALCIUM 9.3 9.6  < > 9.3 8.6 8.8  MG  --  1.6  --   --   --   --   < > = values in this interval not displayed.  Liver Function Tests:  Recent Labs  05/10/13 1014 06/21/13 1455 07/06/13 0610  AST 27 23 25   ALT 7 <6 5  ALKPHOS 94 85 82  BILITOT 0.33 0.32 0.2*  PROT 7.3 6.9 6.4  ALBUMIN 2.7* 2.4* 2.2*    CBC:  Recent Labs  05/10/13 1014 06/21/13 1454 07/05/13 1200 07/06/13 0422 07/07/13 0445  WBC 7.8 5.8 5.5 5.3 5.2  NEUTROABS 5.6 3.4 3.4  --   --   HGB 12.0 11.1* 11.2* 11.0* 11.7*  HCT 35.8 34.1* 34.7* 33.9* 35.8*  MCV 84.0 84.9 82.0 81.7 82.1  PLT 358 388 417* 394 353   Assessment/Plan 1. Breast cancer, stage 4, right -is on hospice care now, has metastatic disease with pleural effusions bilaterally -pain is controlled with current therapy - DNR (Do Not Resuscitate) - bisacodyl (DULCOLAX) 10 MG suppository; Place 1 suppository (10 mg total) rectally every three (3) days as needed for constipation.  Dispense: 12 suppository; Refill: 0 2. Lymphedema, left arm -continue current pain mgt 3. Weakness generalized -was originally sent here for rehab, receiving palliative care services 4. Type II or unspecified type diabetes mellitus without mention of complication, not stated as uncontrolled - insulin aspart (NOVOLOG) 100 UNIT/ML injection; Inject 5 Units into the skin 3 (three) times daily with meals.  Dispense: 1 vial; Refill: 12 -reasonably controlled with goals of care and poor po intake 5.  Anasarca: Lasix 40mg  daily x 3 days and kcl daily x 3 days for weight gain, edema  Family/ staff Communication: discussed with pt and her nurse, reviewed palliative care notes Goals of care: DNR, palliative care

## 2013-08-09 ENCOUNTER — Non-Acute Institutional Stay (SKILLED_NURSING_FACILITY): Payer: Medicare Other | Admitting: Internal Medicine

## 2013-08-09 DIAGNOSIS — C50919 Malignant neoplasm of unspecified site of unspecified female breast: Secondary | ICD-10-CM

## 2013-08-09 DIAGNOSIS — C792 Secondary malignant neoplasm of skin: Secondary | ICD-10-CM

## 2013-08-09 DIAGNOSIS — I890 Lymphedema, not elsewhere classified: Secondary | ICD-10-CM

## 2013-08-17 ENCOUNTER — Non-Acute Institutional Stay (SKILLED_NURSING_FACILITY): Payer: Medicare Other | Admitting: Adult Health

## 2013-08-17 DIAGNOSIS — I890 Lymphedema, not elsewhere classified: Secondary | ICD-10-CM

## 2013-08-17 DIAGNOSIS — I10 Essential (primary) hypertension: Secondary | ICD-10-CM

## 2013-08-17 DIAGNOSIS — C50912 Malignant neoplasm of unspecified site of left female breast: Secondary | ICD-10-CM

## 2013-08-17 DIAGNOSIS — C50919 Malignant neoplasm of unspecified site of unspecified female breast: Secondary | ICD-10-CM

## 2013-08-23 ENCOUNTER — Other Ambulatory Visit: Payer: Self-pay | Admitting: *Deleted

## 2013-08-23 MED ORDER — LORAZEPAM 0.5 MG PO TABS: 0.5000 mg | ORAL_TABLET | Freq: Four times a day (QID) | ORAL | Status: AC | PRN

## 2013-08-23 NOTE — Telephone Encounter (Signed)
Refilled per protocol.

## 2013-09-02 ENCOUNTER — Other Ambulatory Visit: Payer: Self-pay | Admitting: *Deleted

## 2013-09-10 DEATH — deceased

## 2013-09-12 ENCOUNTER — Encounter: Payer: Self-pay | Admitting: Adult Health

## 2013-09-12 NOTE — Progress Notes (Signed)
Patient ID: [name], female   DOB: [birth year], 79 y.o.   MRN: [id number]  [facility]  No Known Allergies  Chief Complaint  Patient presents with  . Medical Managment of Chronic Issues    HPI  She is being seen for the management of her chronic illnesses. She is being followed by hospice care for her end stage breast cancer. She has required frequent use of ativan to help with anxiety and the nursing staff is requesting that this medication be changed to routine in order to provide her better management of her anxiety. Her lipitor; zofran; and glipizide have been stopped.   Past Medical History  Diagnosis Date  . Hypertension   . Diabetes mellitus without complication   . Breast cancer   . Collapse of left lung 02/18/2013  . Pleural effusion on left 02/18/2013  . Breast cancer, stage 4 07/05/2013  . Cancer of upper-outer quadrant of female breast 01/31/2013  . Lymphedema, left arm 01/27/2013  . Weakness generalized 02/21/2013  . Hypoxia 07/05/2013    Past Surgical History  Procedure Laterality Date  . Breast surgery      Filed Vitals:   08/17/13 1150  BP: 145/70  Pulse: 100  Height: 5\' 2"  (1.575 m)  Weight: 208 lb (94.348 kg)    MEDICATIONS  Albuterol 2 puffs every 6 hours as needed norvasc 5 mg daily Flexeril 10 mg twice daily as needed novolog 5 units prior to meals for cbg >200 vicodin 5/325 mg every 4 hours as needed  Ativan 0.5 mg every 6 hours as needed protonix 40 mg daily Ultram 50 mg every 6 hours as needed Phenergan 12.6 mg every 6 hours as needed   Review of Systems  Unable to perform ROS did deny pain  Physical Exam  Constitutional: No distress.  obese  Neck: Neck supple. No JVD present.  Cardiovascular: Normal rate, regular rhythm and intact distal pulses.   Respiratory: Effort normal and breath sounds normal. No respiratory distress.  GI: Soft. Bowel sounds are normal. She exhibits no distension. There is no tenderness.   Musculoskeletal: She exhibits edema.  Has left arm lymphedema; and left breast cancer with skin lesions present   Skin: Skin is warm and dry. She is not diaphoretic.     ASSESSMENT/PLAN  1. Breast cancer; is followed by hospice care: will continue comfort care measures; will continue vicodin 5/325 mg every 4 hours as needed; ultram 50 mg every 6 hours as needed; ultram 50 mg every 6 hours as needed and flexeril 10 mg twice daily as needed. Will change her ativan 0.5 mg to every 6 hours routinely and will continue to monitor her status   2. Lymphedema: will continue to provide her with comfort measure and will continue to monitor her status  3. Hypertension: will continue norvasc 5 mg daily

## 2013-11-05 IMAGING — CT CT ABD-PELV W/O CM
2 of 6 series · 16 of 46 positions shown, 18 images · IV contrast (OMNIPAQUE)
Comparison: 03/23/2009; chest CT from 02/18/2013

CLINICAL DATA: Right breast cancer.  Lymphedema.  Diabetes.
Hypertension.

CT ABDOMEN AND PELVIS WITHOUT CONTRAST
TECHNIQUE: Multidetector CT imaging of the abdomen and pelvis was
performed following the standard protocol without intravenous
contrast.

[Series 5: rtn a/p with · axial · 0.81mm/px · z∈[-1324,-930]mm · 13 of 91 slices shown, 15 images]
[im 6/91  soft-tissue]
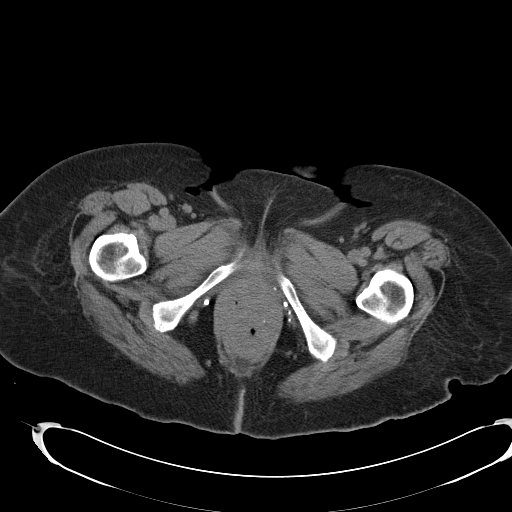
[im 6/91  bone]
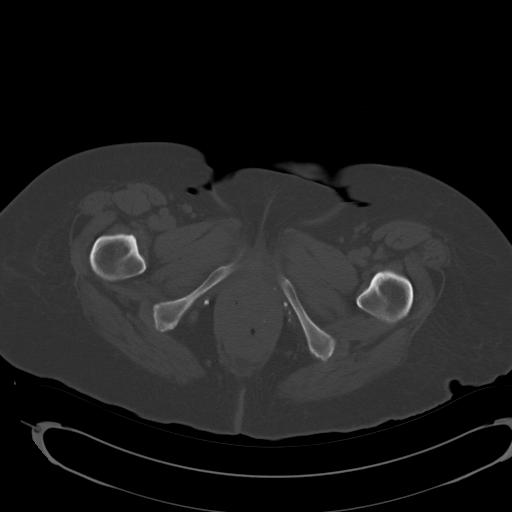
[im 12/91  soft-tissue]
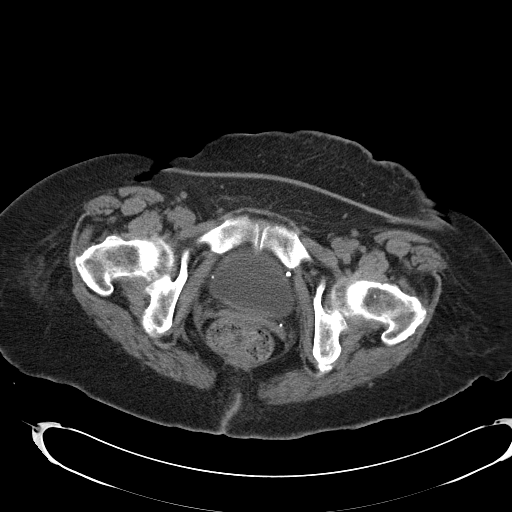
[im 17/91  soft-tissue]
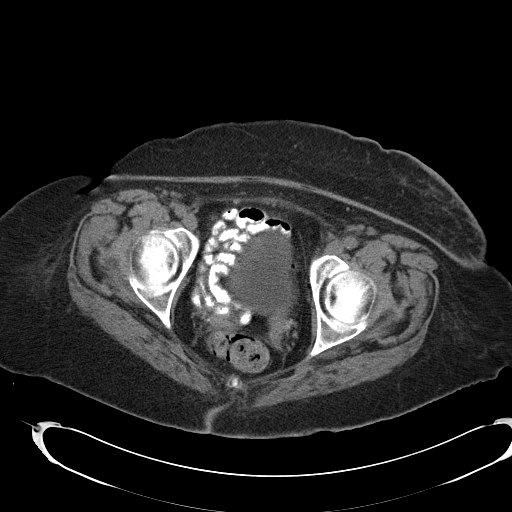
[im 29/91  soft-tissue]
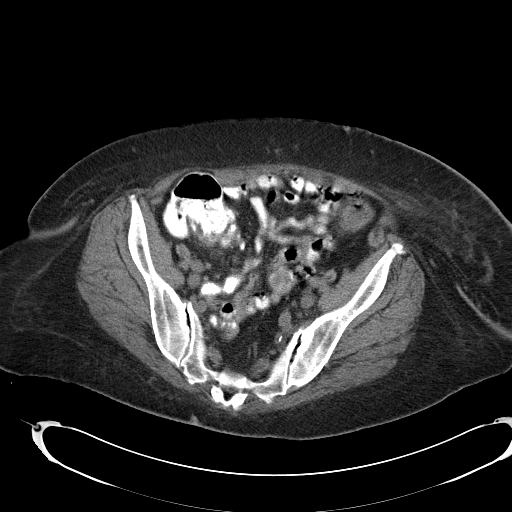
[im 34/91  soft-tissue]
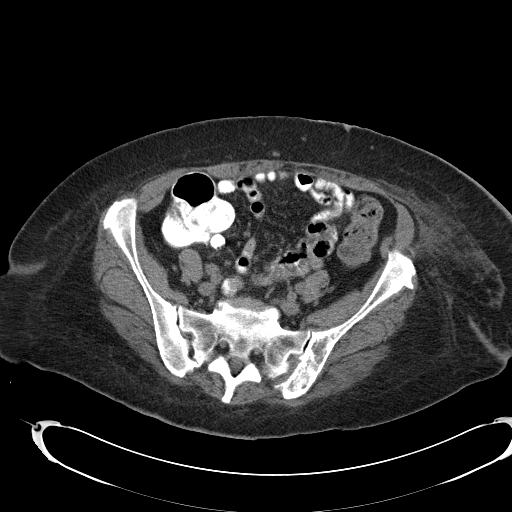
[im 40/91  soft-tissue]
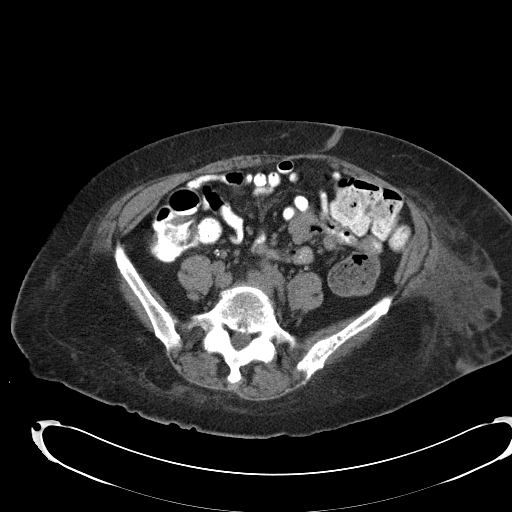
[im 46/91  soft-tissue]
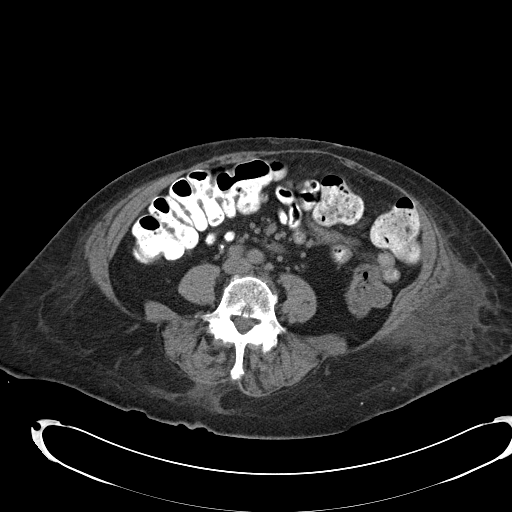
[im 51/91  soft-tissue]
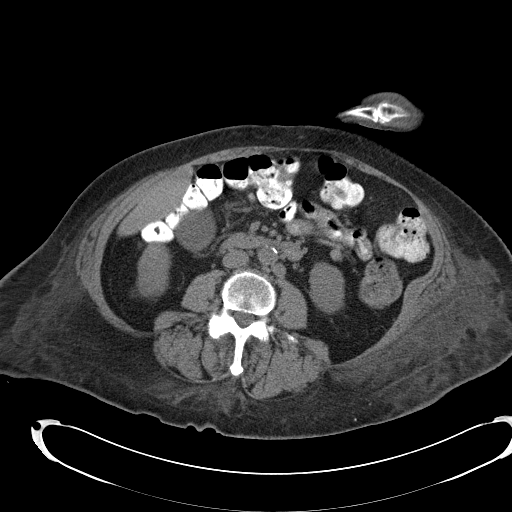
[im 57/91  soft-tissue]
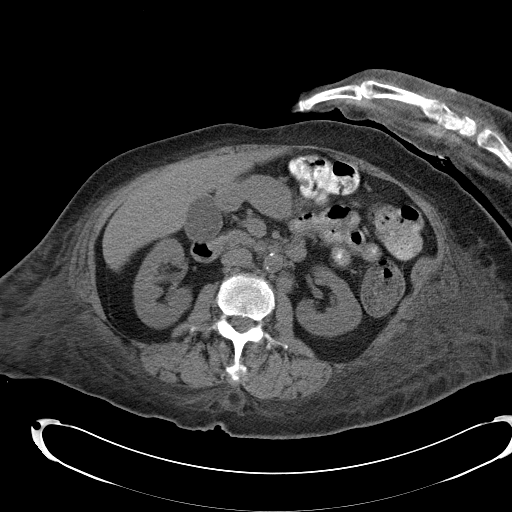
[im 57/91  bone]
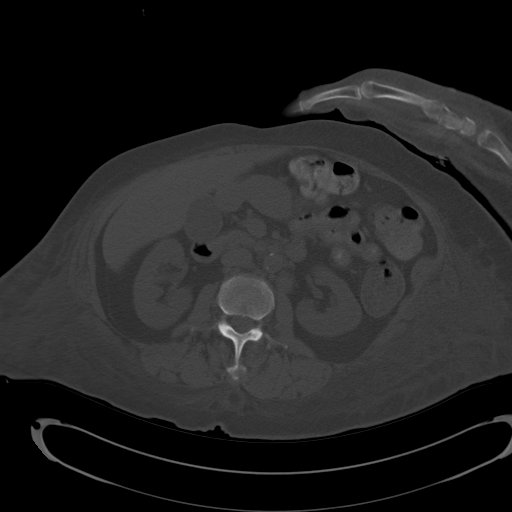
[im 62/91  soft-tissue]
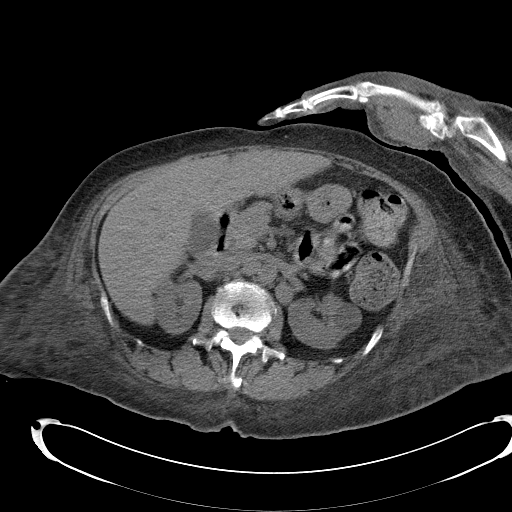
[im 74/91  soft-tissue]
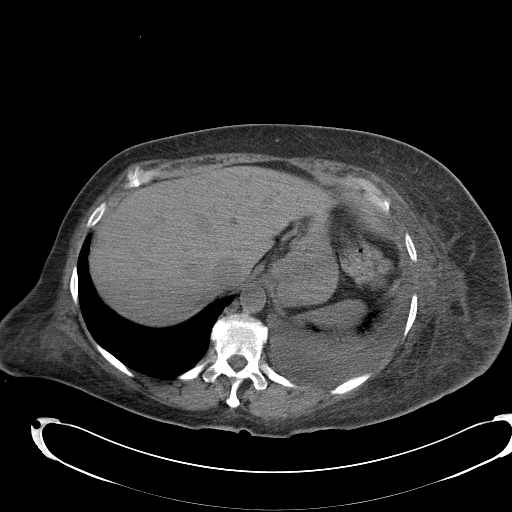
[im 79/91  soft-tissue]
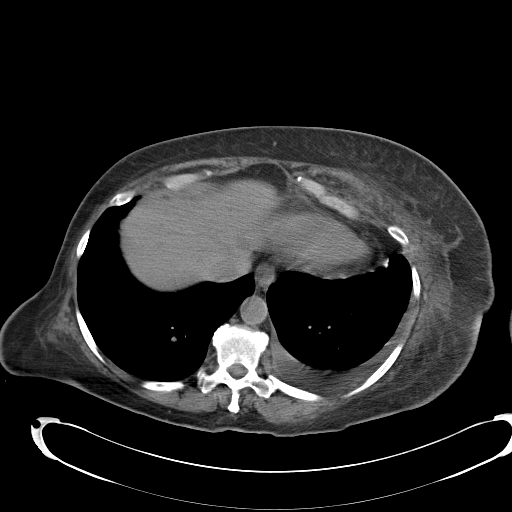
[im 85/91  soft-tissue]
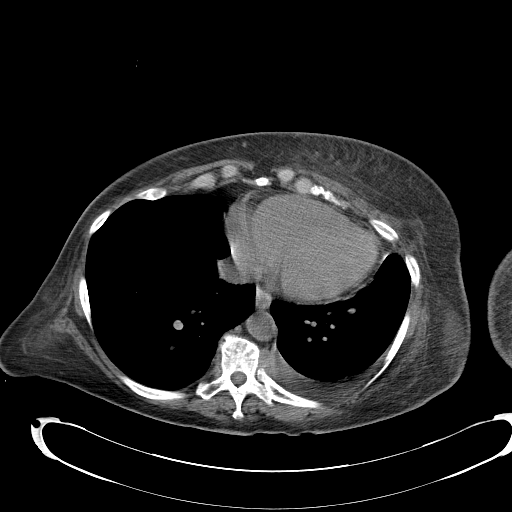

[Series 602: <mpr thick range> · coronal · 0.88mm/px · 3 of 90 slices shown]
[im 30/90  soft-tissue]
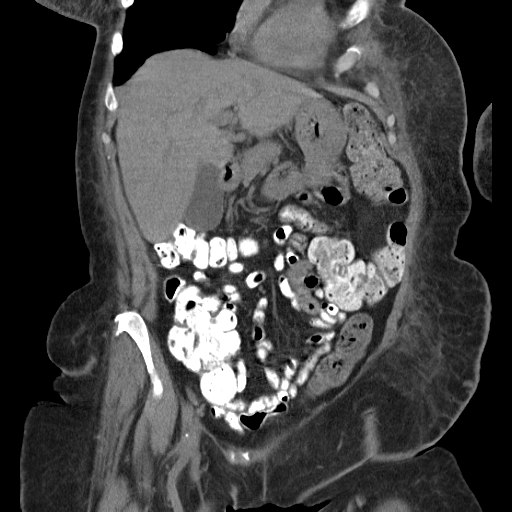
[im 40/90  soft-tissue]
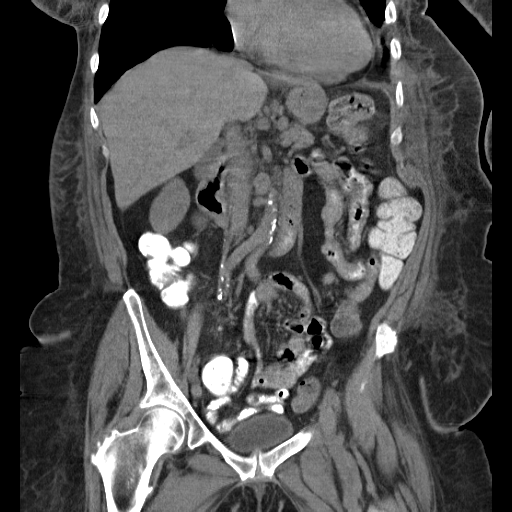
[im 50/90  soft-tissue]
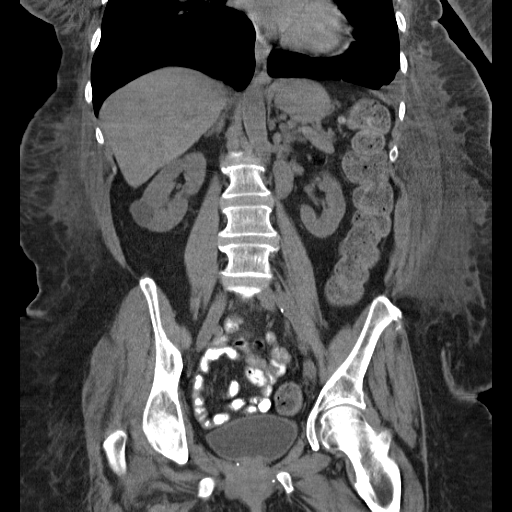

[16 of 46 positions shown; findings below may reference images not displayed]

FINDINGS: Moderate left pleural effusion has reduced in size
compared to 02/18/2013.  There continues to be edema in the lower
portion of the right breast and tracking along the chest wall and
to a lesser extent along the abdominal wall, left greater than
right.

There appears to be some subcutaneous nodularity along the left
posterior chest on the top most images.

Gastrohepatic ligament lymph node short axis 1.4 cm, image 20 of
series 5.  Suspected portacaval adenopathy.  Pathologic
retroperitoneal adenopathy noted, with an index left periaortic
node at 1.8 cm and a lymph node adjacent to the left adrenal gland
measuring 1.9 cm in short axis.  Multiple additional abnormal
enlarged retroperitoneal lymph nodes are present.  Mildly
abnormally enlarged central mesenteric lymph nodes are present.

Bilateral fluid density renal lesions have increased in size
compared to 1313.  Aortoiliac atherosclerotic calcification noted.

Orally administered contrast extends through to the colon.
Scattered colonic diverticula noted.

Uterus absent.  Urinary bladder unremarkable.

5.0 by 2.5 cm mass of the left iliac bone noted with moth-eaten
appearance and extraosseous extension medially on image 56 of
series 5.  Lower lumbar facet arthropathy noted.

Gallbladder appears unremarkable.  No renal stones observed.
Incidental subcutaneous edema noted in the left hand, which is
included in the imaging field.
IMPRESSION: 1.  Pathologic adenopathy in the gastrohepatic ligament and
retroperitoneum compatible with malignancy.
2.  Moth-eaten pattern destructive bony lesion of the left upper
sacrum posteriorly, with extraosseous extension, compatible with
metastatic disease.
3.  Reduced size of the left pleural effusion.
4.  Diffuse subcutaneous edema in the lower chest and upper
abdomen.
5.  Mild nodularity in the subcutaneous tissues of the left
posterior chest.
6.  Slowly enlarging hypodense lesions in the kidneys, likely
cysts, although not fully characterized without contrast.

## 2014-03-21 ENCOUNTER — Encounter: Payer: Self-pay | Admitting: Internal Medicine

## 2014-03-21 NOTE — Progress Notes (Signed)
Patient ID: [name], female   DOB: [birth year], 80 y.o.   MRN: [id number]  [facility]  Provider:  Jonelle Sidle L. Mariea Clonts, D.O., C.M.D.  No chief complaint on file.  HPI:  Unfortunate 80 yo female with h/o metastatic breast cancer end stage, obesity, lymphedema especially of her left side, DMII seen for worsening of the skin lesions from her metastatic breast cancer.  She is on her rehab benefit though she can only very minimally participate.  She is having more pain and foul smelling drainage from the lesions on her left shoulder.  She is confused about her disease and her family's wishes at this point.    Review of Systems:  Review of Systems  Constitutional: Positive for weight loss and malaise/fatigue. Negative for fever and chills.  Respiratory: Positive for shortness of breath.   Cardiovascular: Positive for chest pain and leg swelling.       External  Gastrointestinal: Negative for abdominal pain and constipation.  Genitourinary: Negative for dysuria.  Musculoskeletal: Positive for myalgias.  Skin: Positive for rash.  Neurological: Positive for weakness. Negative for dizziness, loss of consciousness and headaches.  Psychiatric/Behavioral: Positive for depression and memory loss.    Medications: Patient's Medications  New Prescriptions   No medications on file  Previous Medications   ALBUTEROL (PROVENTIL HFA;VENTOLIN HFA) 108 (90 BASE) MCG/ACT INHALER    Inhale 2 puffs into the lungs every 6 (six) hours as needed for wheezing.   AMLODIPINE (NORVASC) 5 MG TABLET    Take 5 mg by mouth daily.   BISACODYL (DULCOLAX) 10 MG SUPPOSITORY    Place 1 suppository (10 mg total) rectally every three (3) days as needed for constipation.   CYCLOBENZAPRINE (FLEXERIL) 10 MG TABLET    Take 10 mg by mouth 2 (two) times daily as needed for muscle spasms.    HYDROCODONE-ACETAMINOPHEN (NORCO/VICODIN) 5-325 MG PER TABLET    Take 1 tablet by mouth every 4 (four) hours as needed.   INSULIN ASPART (NOVOLOG) 100 UNIT/ML INJECTION    Inject 5 Units into the skin 3 (three) times daily with meals.   LORAZEPAM (ATIVAN) 0.5 MG TABLET    Take 1 tablet (0.5 mg total) by mouth every 6 (six) hours as needed for anxiety.   PANTOPRAZOLE (PROTONIX) 40 MG TABLET    Take 40 mg by mouth daily.   TRAMADOL (ULTRAM) 50 MG TABLET    Take 1 tablet (50 mg total) by mouth every 6 (six) hours as needed for pain.  Modified Medications   No medications on file  Discontinued Medications   No medications on file    Physical Exam:  Physical Exam  Constitutional:  Morbidly obese black female resting in bed  HENT:  Head: Normocephalic and atraumatic.  Cardiovascular: Normal rate, regular rhythm and normal heart sounds.   Pulmonary/Chest: Effort normal. She has rales.  Abdominal: Soft. Bowel sounds are normal. She exhibits no distension and no mass. There is no tenderness.  Neurological: She is alert.  Skin:  Pustular rash (metastatic lesions) over her chest especially on the left chest and shoulder even onto her back now with foul-smelling clear drainage present     Labs reviewed: Basic Metabolic Panel:  Recent Labs  07/05/13 1200 07/06/13 0610 07/07/13 0445  NA 132* 134* 132*  K 4.6 5.0 5.0  CL 102 103 102  CO2 17* 18* 18*  GLUCOSE 102* 111* 133*  BUN 20 18 18   CREATININE 1.70* 1.43* 1.28*  CALCIUM 9.3 8.6 8.8  Liver Function Tests:  Recent Labs  05/10/13 1014 06/21/13 1455 07/06/13 0610  AST 27 23 25   ALT 7 <6 5  ALKPHOS 94 85 82  BILITOT 0.33 0.32 0.2*  PROT 7.3 6.9 6.4  ALBUMIN 2.7* 2.4* 2.2*    CBC:  Recent Labs  05/10/13 1014 06/21/13 1454 07/05/13 1200 07/06/13 0422 07/07/13 0445  WBC 7.8 5.8 5.5 5.3 5.2  NEUTROABS 5.6 3.4 3.4  --   --   HGB 12.0 11.1* 11.2* 11.0* 11.7*  HCT 35.8 34.1* 34.7* 33.9* 35.8*  MCV 84.0 84.9 82.0 81.7 82.1  PLT 358 388 417* 394 353   Assessment/Plan 1. Breast cancer, stage 4 -end stage -she is receiving  palliative care, but would greatly benefit now from hospice care either here (if family cannot afford room and board) or at [facility] to improve pain control -referral placed  2. Lymphedema -persists, elevating affected arm and cont lasix as needed to help when she becomes dyspneic  3. Breast carcinoma metastatic, skin -will treat the affected areas with dakins solution to help with the odor -increase pain medication to help with pain  Family/ staff Communication: seen with unit supervisor and discussed with DNS also--both of [place] tried to reach her son w/o response  Goals of care: DNR, palliative care with pending hospice referral

## 2014-06-19 ENCOUNTER — Ambulatory Visit: Payer: Medicare Other | Admitting: Oncology

## 2014-06-19 ENCOUNTER — Other Ambulatory Visit: Payer: Medicare Other | Admitting: Lab
